# Patient Record
Sex: Male | Born: 1965
Health system: Southern US, Community
[De-identification: ages and names within clinical notes are randomized; demographics above are authoritative.]

## PROBLEM LIST (undated history)

## (undated) DIAGNOSIS — L659 Nonscarring hair loss, unspecified: Secondary | ICD-10-CM

## (undated) DIAGNOSIS — G4700 Insomnia, unspecified: Secondary | ICD-10-CM

## (undated) HISTORY — PX: ROTATOR CUFF REPAIR: SHX139

## (undated) HISTORY — DX: Insomnia, unspecified: G47.00

## (undated) HISTORY — DX: Nonscarring hair loss, unspecified: L65.9

---

## 2000-08-02 DIAGNOSIS — L649 Androgenic alopecia, unspecified: Secondary | ICD-10-CM | POA: Insufficient documentation

## 2001-02-07 DIAGNOSIS — E7800 Pure hypercholesterolemia, unspecified: Secondary | ICD-10-CM | POA: Insufficient documentation

## 2002-08-02 DIAGNOSIS — J309 Allergic rhinitis, unspecified: Secondary | ICD-10-CM | POA: Insufficient documentation

## 2004-08-02 DIAGNOSIS — B009 Herpesviral infection, unspecified: Secondary | ICD-10-CM | POA: Insufficient documentation

## 2007-11-17 DIAGNOSIS — G4700 Insomnia, unspecified: Secondary | ICD-10-CM | POA: Insufficient documentation

## 2008-10-10 DIAGNOSIS — F419 Anxiety disorder, unspecified: Secondary | ICD-10-CM | POA: Insufficient documentation

## 2009-01-14 ENCOUNTER — Ambulatory Visit: Payer: Self-pay | Admitting: Family Medicine

## 2010-02-27 ENCOUNTER — Ambulatory Visit: Payer: Self-pay | Admitting: Orthopedic Surgery

## 2010-03-19 ENCOUNTER — Observation Stay: Payer: Self-pay | Admitting: Orthopedic Surgery

## 2010-03-19 ENCOUNTER — Ambulatory Visit: Payer: Self-pay | Admitting: Orthopedic Surgery

## 2011-04-12 ENCOUNTER — Emergency Department: Payer: Self-pay | Admitting: Unknown Physician Specialty

## 2011-04-29 ENCOUNTER — Ambulatory Visit: Payer: Self-pay | Admitting: Orthopedic Surgery

## 2011-05-05 ENCOUNTER — Ambulatory Visit: Payer: Self-pay | Admitting: Orthopedic Surgery

## 2013-08-30 ENCOUNTER — Ambulatory Visit: Payer: Self-pay | Admitting: Otolaryngology

## 2014-09-30 ENCOUNTER — Ambulatory Visit: Payer: Self-pay | Admitting: Gastroenterology

## 2014-09-30 LAB — HM COLONOSCOPY

## 2015-01-18 ENCOUNTER — Other Ambulatory Visit: Payer: Self-pay | Admitting: Family Medicine

## 2015-01-20 ENCOUNTER — Encounter: Payer: Self-pay | Admitting: Family Medicine

## 2015-01-20 ENCOUNTER — Ambulatory Visit (INDEPENDENT_AMBULATORY_CARE_PROVIDER_SITE_OTHER): Payer: BLUE CROSS/BLUE SHIELD | Admitting: Family Medicine

## 2015-01-20 VITALS — BP 116/82 | HR 65 | Temp 98.2°F | Resp 16 | Ht 70.5 in | Wt 212.2 lb

## 2015-01-20 DIAGNOSIS — R0789 Other chest pain: Secondary | ICD-10-CM | POA: Diagnosis not present

## 2015-01-20 DIAGNOSIS — R079 Chest pain, unspecified: Secondary | ICD-10-CM | POA: Insufficient documentation

## 2015-01-20 MED ORDER — CLONAZEPAM 0.5 MG PO TABS: 0.5000 mg | ORAL_TABLET | Freq: Two times a day (BID) | ORAL | Status: DC | PRN

## 2015-01-20 NOTE — Progress Notes (Signed)
Subjective:     Patient ID: Luis Ray, male   DOB: [birth year], 49 y.o.   MRN: [id number]  HPI  Chief Complaint  Patient presents with  . Chest Pain    Patient comes in ofice today with complaints of chest pain for 7 days described as tightness in the middle of his chest, patient denies radiation of pain but states that he has a family history of heart disease  Describes more as intermittent, non-exertional retrosternal pressure with no associated nausea/shortness of breath/palpitations. States he can feel it mildly with deep inspiration. Has been taking ASA 81 mg for the last few days. Denies heartburn or reflux at this time. Modest control of allergies with otc antihistamines. Paternal hx of M.I. Last cholesterol in January with low 10 year cardiovascular risk.   Review of Systems  Constitutional: Negative for fever and chills.  Respiratory: Negative for cough.   Cardiovascular:       See HPI       Objective:   Physical Exam  Constitutional: He appears well-developed and well-nourished. No distress.  Cardiovascular: Normal rate and regular rhythm.   Pulmonary/Chest: Breath sounds normal. He exhibits no tenderness.  Musculoskeletal: He exhibits no edema (in lower extremities).       Assessment:     1. Chest tightness or pressure - EKG 12-Lead - Ambulatory referral to Cardiology - clonazePAM (KLONOPIN) 0.5 MG tablet; Take 1 tablet (0.5 mg total) by mouth 2 (two) times daily as needed for anxiety.  Dispense: 20 tablet; Refill: 1    Plan:    continue 81 mg. ASA

## 2015-01-20 NOTE — Patient Instructions (Signed)
Continue 81 mg. Aspirin Try clonazepam in the evening as it can make you sleepy

## 2015-01-21 ENCOUNTER — Encounter: Payer: Self-pay | Admitting: Cardiovascular Disease

## 2015-01-21 ENCOUNTER — Ambulatory Visit (INDEPENDENT_AMBULATORY_CARE_PROVIDER_SITE_OTHER): Payer: BLUE CROSS/BLUE SHIELD | Admitting: Cardiovascular Disease

## 2015-01-21 VITALS — BP 110/78 | HR 67 | Ht 70.5 in | Wt 208.0 lb

## 2015-01-21 DIAGNOSIS — R0789 Other chest pain: Secondary | ICD-10-CM

## 2015-01-21 NOTE — Progress Notes (Signed)
Primary care physician: Dr. Sherrie Mustache  HPI  This is a pleasant 49 year old male who was referred by Anola Gurney for evaluation of chest pain. The patient has no previous cardiac history. He does have family history of coronary artery disease. His father had CABG at the age of 12. Multiple members on his mother's side had myocardial infarction in their 74s and 60s. He has mild hyperlipidemia not on medications. He has no history of diabetes or hypertension. He is not a smoker. He works as a Brewing technologist. Over the last 2 weeks, he has episodes of substernal chest pressure with no radiation. It is mainly happens when he is under stress and does not happen with physical activities. He has been active throughout his life but has slowed down in the recent years with disease scheduled. He did ride his bike yesterday for 3 miles without significant symptoms.  Allergies  Allergen Reactions  . Sulfa Antibiotics Other (See Comments)    Upset stomach     Current Outpatient Prescriptions on File Prior to Visit  Medication Sig Dispense Refill  . clonazePAM (KLONOPIN) 0.5 MG tablet Take 1 tablet (0.5 mg total) by mouth 2 (two) times daily as needed for anxiety. 20 tablet 1  . finasteride (PROSCAR) 5 MG tablet TAKE ONE-FOURTH TABLET DAILY 30 tablet 11  . fluticasone (FLONASE) 50 MCG/ACT nasal spray Place into the nose.    . valACYclovir (VALTREX) 1000 MG tablet Take by mouth.     No current facility-administered medications on file prior to visit.     Past Medical History  Diagnosis Date  . Alopecia   . Insomnia      Past Surgical History  Procedure Laterality Date  . Rotator cuff repair Right      Family History  Problem Relation Age of Onset  . Coronary artery disease Father      History   Social History  . Marital Status: Married    Spouse Name: N/A  . Number of Children: N/A  . Years of Education: N/A   Occupational History  . Not on file.   Social History Main  Topics  . Smoking status: Never Smoker   . Smokeless tobacco: Not on file  . Alcohol Use: 0.0 oz/week    0 Standard drinks or equivalent per week     Comment: rare  . Drug Use: No  . Sexual Activity: Not on file   Other Topics Concern  . Not on file   Social History Narrative     ROS A 10 point review of system was performed. It is negative other than that mentioned in the history of present illness.   PHYSICAL EXAM   BP 110/78 mmHg  Pulse 67  Ht 5' 10.5" (1.791 m)  Wt 208 lb (94.348 kg)  BMI 29.41 kg/m2 Constitutional: He is oriented to person, place, and time. He appears well-developed and well-nourished. No distress.  HENT: No nasal discharge.  Head: Normocephalic and atraumatic.  Eyes: Pupils are equal and round.  No discharge. Neck: Normal range of motion. Neck supple. No JVD present. No thyromegaly present.  Cardiovascular: Normal rate, regular rhythm, normal heart sounds. Exam reveals no gallop and no friction rub. No murmur heard.  Pulmonary/Chest: Effort normal and breath sounds normal. No stridor. No respiratory distress. He has no wheezes. He has no rales. He exhibits no tenderness.  Abdominal: Soft. Bowel sounds are normal. He exhibits no distension. There is no tenderness. There is no rebound and no guarding.  Musculoskeletal: Normal range of motion. He exhibits no edema and no tenderness.  Neurological: He is alert and oriented to person, place, and time. Coordination normal.  Skin: Skin is warm and dry. No rash noted. He is not diaphoretic. No erythema. No pallor.  Psychiatric: He has a normal mood and affect. His behavior is normal. Judgment and thought content normal.       VOH:YWVPX  Rhythm  WITHIN NORMAL LIMITS   ASSESSMENT AND PLAN

## 2015-01-21 NOTE — Patient Instructions (Signed)
Medication Instructions:  Your physician recommends that you continue on your current medications as directed. Please refer to the Current Medication list given to you today.  Labwork: none  Testing/Procedures: Your physician has requested that you have an exercise tolerance test.    Follow-Up: Your physician recommends that you schedule a follow-up appointment as needed with Dr. Arida.    Any Other Special Instructions Will Be Listed Below (If Applicable).  Exercise Stress Electrocardiogram An exercise stress electrocardiogram is a test that is done to evaluate the blood supply to your heart. This test may also be called exercise stress electrocardiography. The test is done while you are walking on a treadmill. The goal of this test is to raise your heart rate. This test is done to find areas of poor blood flow to the heart by determining the extent of coronary artery disease (CAD).   CAD is defined as narrowing in one or more heart (coronary) arteries of more than 70%. If you have an abnormal test result, this may mean that you are not getting adequate blood flow to your heart during exercise. Additional testing may be needed to understand why your test was abnormal. LET YOUR HEALTH CARE PROVIDER KNOW ABOUT:   Any allergies you have.  All medicines you are taking, including vitamins, herbs, eye drops, creams, and over-the-counter medicines.  Previous problems you or members of your family have had with the use of anesthetics.  Any blood disorders you have.  Previous surgeries you have had.  Medical conditions you have.  Possibility of pregnancy, if this applies. RISKS AND COMPLICATIONS Generally, this is a safe procedure. However, as with any procedure, complications can occur. Possible complications can include:  Pain or pressure in the following areas:  Chest.  Jaw or neck.  Between your shoulder blades.  Radiating down your left arm.  Dizziness or  light-headedness.  Shortness of breath.  Increased or irregular heartbeats.  Nausea or vomiting.  Heart attack (rare). BEFORE THE PROCEDURE  Avoid all forms of caffeine 24 hours before your test or as directed by your health care provider. This includes coffee, tea (even decaffeinated tea), caffeinated sodas, chocolate, cocoa, and certain pain medicines.  Follow your health care provider's instructions regarding eating and drinking before the test.  Take your medicines as directed at regular times with water unless instructed otherwise. Exceptions may include:  If you have diabetes, ask how you are to take your insulin or pills. It is common to adjust insulin dosing the morning of the test.  If you are taking beta-blocker medicines, it is important to talk to your health care provider about these medicines well before the date of your test. Taking beta-blocker medicines may interfere with the test. In some cases, these medicines need to be changed or stopped 24 hours or more before the test.  If you wear a nitroglycerin patch, it may need to be removed prior to the test. Ask your health care provider if the patch should be removed before the test.  If you use an inhaler for any breathing condition, bring it with you to the test.  If you are an outpatient, bring a snack so you can eat right after the stress phase of the test.  Do not smoke for 4 hours prior to the test or as directed by your health care provider.  Do not apply lotions, powders, creams, or oils on your chest prior to the test.  Wear loose-fitting clothes and comfortable shoes for the test.   This test involves walking on a treadmill. PROCEDURE  Multiple patches (electrodes) will be put on your chest. If needed, small areas of your chest may have to be shaved to get better contact with the electrodes. Once the electrodes are attached to your body, multiple wires will be attached to the electrodes and your heart rate will  be monitored.  Your heart will be monitored both at rest and while exercising.  You will walk on a treadmill. The treadmill will be started at a slow pace. The treadmill speed and incline will gradually be increased to raise your heart rate. AFTER THE PROCEDURE  Your heart rate and blood pressure will be monitored after the test.  You may return to your normal schedule including diet, activities, and medicines, unless your health care provider tells you otherwise. Document Released: 07/16/2000 Document Revised: 07/24/2013 Document Reviewed: 03/26/2013 ExitCare Patient Information 2015 [employer]. This information is not intended to replace advice given to you by your health care provider. Make sure you discuss any questions you have with your health care provider.  

## 2015-01-21 NOTE — Assessment & Plan Note (Signed)
The patient's chest pain is atypical and likely stress induced. Physical exam is unremarkable and baseline ECG is normal. Given his family history and risk factors for coronary artery disease, I requested a treadmill stress test. I discussed with the patient the importance of lifestyle changes in order to decrease the chance of future coronary artery disease and cardiovascular events. We discussed the importance of controlling risk factors, healthy diet as well as regular exercise. I also explained to him that a normal stress test does not rule out atherosclerosis.

## 2015-02-13 ENCOUNTER — Ambulatory Visit (INDEPENDENT_AMBULATORY_CARE_PROVIDER_SITE_OTHER): Payer: BLUE CROSS/BLUE SHIELD

## 2015-02-13 DIAGNOSIS — R0789 Other chest pain: Secondary | ICD-10-CM

## 2015-02-13 NOTE — Patient Instructions (Signed)
Your stress test did not show any signs concerning for ischemia  Dr. Kirke CorinArida will review your readings   Call or return to clinic prn if these symptoms worsen or fail to improve as anticipated.

## 2015-03-07 ENCOUNTER — Other Ambulatory Visit: Payer: Self-pay | Admitting: Family Medicine

## 2015-04-30 ENCOUNTER — Other Ambulatory Visit: Payer: Self-pay | Admitting: Family Medicine

## 2015-04-30 ENCOUNTER — Telehealth: Payer: Self-pay | Admitting: Family Medicine

## 2015-04-30 NOTE — Telephone Encounter (Signed)
Needs refill on clonazePAM (KLONOPIN) 0.5 MG tablet 03/07/15 -- Anola Gurney, PA TAKE 1 TABLET TWICE A DAY AS NEEDED FOR ANXIETY  Call back is [phone]  Thanks Barth Kirks

## 2015-04-30 NOTE — Telephone Encounter (Signed)
Pt's wife Cala Bradford is requesting a refill for clonazePAM (KLONOPIN) 0.5 MG tablet sent to [facility] with multiple refills so they don't have to request it every month. Thanks TNP

## 2015-04-30 NOTE — Telephone Encounter (Signed)
Spoke to patient on phone medication was picked up from pharmacy on 03/11/15, with script you sent it had 1 additional refill. Patient stated his bottle had no refills. I contacted [facility] pharmacy who informed me that script was filled on 03/11/15 then again on 04/04/15. Please advise. KW

## 2015-07-15 ENCOUNTER — Other Ambulatory Visit: Payer: Self-pay | Admitting: Family Medicine

## 2015-09-16 ENCOUNTER — Ambulatory Visit
Admission: RE | Admit: 2015-09-16 | Discharge: 2015-09-16 | Disposition: A | Discharge status: Home / Self Care | Payer: BLUE CROSS/BLUE SHIELD | Source: Ambulatory Visit | Attending: Family Medicine | Admitting: Family Medicine

## 2015-09-16 ENCOUNTER — Ambulatory Visit (INDEPENDENT_AMBULATORY_CARE_PROVIDER_SITE_OTHER): Payer: BLUE CROSS/BLUE SHIELD | Admitting: Family Medicine

## 2015-09-16 ENCOUNTER — Telehealth: Payer: Self-pay

## 2015-09-16 ENCOUNTER — Encounter: Payer: Self-pay | Admitting: Family Medicine

## 2015-09-16 VITALS — BP 118/80 | HR 60 | Temp 98.5°F | Resp 16 | Wt 219.0 lb

## 2015-09-16 DIAGNOSIS — R739 Hyperglycemia, unspecified: Secondary | ICD-10-CM | POA: Insufficient documentation

## 2015-09-16 DIAGNOSIS — R079 Chest pain, unspecified: Secondary | ICD-10-CM | POA: Diagnosis not present

## 2015-09-16 DIAGNOSIS — F430 Acute stress reaction: Secondary | ICD-10-CM

## 2015-09-16 DIAGNOSIS — F439 Reaction to severe stress, unspecified: Secondary | ICD-10-CM | POA: Diagnosis not present

## 2015-09-16 DIAGNOSIS — K219 Gastro-esophageal reflux disease without esophagitis: Secondary | ICD-10-CM | POA: Diagnosis not present

## 2015-09-16 DIAGNOSIS — E7800 Pure hypercholesterolemia, unspecified: Secondary | ICD-10-CM

## 2015-09-16 MED ORDER — OMEPRAZOLE 20 MG PO CPDR: 20.0000 mg | DELAYED_RELEASE_CAPSULE | Freq: Every day | ORAL | Status: DC

## 2015-09-16 NOTE — Telephone Encounter (Signed)
Advised patient as below.  

## 2015-09-16 NOTE — Telephone Encounter (Signed)
Left message to call back  

## 2015-09-16 NOTE — Telephone Encounter (Signed)
LMTCB 09/16/2015  Thanks,   -Sherrell Weir  

## 2015-09-16 NOTE — Telephone Encounter (Signed)
-----   Message from Lorie Phenix, MD sent at 09/16/2015  3:10 PM EST ----- Normal. Please notify patient.  Thanks.

## 2015-09-16 NOTE — Progress Notes (Signed)
Patient ID: Luis Ray, Ray   DOB: 1966-06-19, 50 y.o.   MRN: [id number]         Patient: Luis Ray    DOB: 1966/03/12   50 y.o.   MRN: [id number] Visit Date: 09/16/2015  Today's Provider: Lorie Phenix, MD   Chief Complaint  Patient presents with  . Chest Pain   Subjective:    Chest Pain  This is a new problem. The current episode started more than 1 month ago. The onset quality is gradual. The problem occurs daily. The pain is present in the substernal region. The pain is at a severity of 3/10. The quality of the pain is described as tightness and pressure. The pain does not radiate. Associated symptoms include shortness of breath. Pertinent negatives include no abdominal pain, back pain, cough, diaphoresis, dizziness, exertional chest pressure, fever, headaches, irregular heartbeat, leg pain, lower extremity edema, nausea, near-syncope, numbness, palpitations, syncope, vomiting or weakness. Risk factors include Ray gender.  Pertinent negatives for past medical history include no seizures.       Allergies  Allergen Reactions  . Sulfa Antibiotics Other (See Comments)    Upset stomach   Previous Medications   ASPIRIN EC 81 MG TABLET    Take 81 mg by mouth daily.   CLONAZEPAM (KLONOPIN) 0.5 MG TABLET    1/2 tablet twice daily as needed for anxiety   FINASTERIDE (PROSCAR) 5 MG TABLET    TAKE ONE-FOURTH TABLET DAILY   FLUTICASONE (FLONASE) 50 MCG/ACT NASAL SPRAY    Place into the nose.   VALACYCLOVIR (VALTREX) 1000 MG TABLET    TAKE TWO TABLETS TWICE A DAY AS NEEDED    Review of Systems  Constitutional: Positive for fatigue. Negative for fever, chills, diaphoresis, activity change, appetite change and unexpected weight change.  Respiratory: Positive for chest tightness and shortness of breath. Negative for apnea, cough, choking and wheezing.   Cardiovascular: Positive for chest pain. Negative for palpitations, leg swelling, syncope and near-syncope.  Gastrointestinal:  Negative for nausea, vomiting, abdominal pain, diarrhea, constipation, blood in stool, abdominal distention, anal bleeding and rectal pain.  Musculoskeletal: Negative for back pain.  Neurological: Negative for dizziness, seizures, syncope, speech difficulty, weakness, light-headedness, numbness and headaches.    Social History  Substance Use Topics  . Smoking status: Never Smoker   . Smokeless tobacco: Not on file  . Alcohol Use: 0.0 oz/week    0 Standard drinks or equivalent per week     Comment: rare   Objective:   BP 118/80 mmHg  Pulse 60  Temp(Src) 98.5 F (36.9 C) (Oral)  Resp 16  Wt 219 lb (99.338 kg)  Physical Exam  Constitutional: He is oriented to person, place, and time. He appears well-developed and well-nourished.  Cardiovascular: Normal rate, regular rhythm and normal heart sounds.   Pulmonary/Chest: Effort normal and breath sounds normal.  Musculoskeletal: Normal range of motion. He exhibits no tenderness.  Neurological: He is alert and oriented to person, place, and time.  Psychiatric: He has a normal mood and affect. His behavior is normal. Judgment and thought content normal.      Assessment & Plan:     1. Chest pain, unspecified chest pain type New problem. EKG normal today. Will check CXR.  - EKG 12-Lead - DG Chest 2 View  2. Hypercholesterolemia without hypertriglyceridemia Elevated last check. Has gained weight. Will recheck.  - CBC with Differential/Platelet - Lipid panel  3. Acute stress disorder Very anxious. Check TSH. Decrease caffeine.  -  TSH  4. Hyperglycemia Has gained weight. Does have family history of diabetes. Check labs.   - CBC with Differential/Platelet - Comprehensive metabolic panel - Hemoglobin A1c  5. Gastroesophageal reflux disease without esophagitis May be cause of pain. Discussed starting medication, healthy lifestyle changes, weight loss, increase exercise.  Decreased carbohydrates. Further plan pending these results.    - omeprazole (PRILOSEC) 20 MG capsule; Take 1 capsule (20 mg total) by mouth daily.  Dispense: 90 capsule; Refill: 1     Patient was seen and examined by Leo Grosser, MD, and note scribed by Kavin Leech, CMA.  I have reviewed the document for accuracy and completeness and I agree with above. - Leo Grosser, MD   Lorie Phenix, MD  [facility]

## 2015-09-16 NOTE — Telephone Encounter (Signed)
-----   Message from Nancy Maloney, MD sent at 09/16/2015  3:10 PM EST ----- Normal. Please notify patient.  Thanks. 

## 2015-09-18 ENCOUNTER — Telehealth: Payer: Self-pay

## 2015-09-18 LAB — COMPREHENSIVE METABOLIC PANEL
A/G RATIO: 2 (ref 1.1–2.5)
ALBUMIN: 4.5 g/dL (ref 3.5–5.5)
ALK PHOS: 60 IU/L (ref 39–117)
ALT: 28 IU/L (ref 0–44)
AST: 21 IU/L (ref 0–40)
BUN/Creatinine Ratio: 18 (ref 9–20)
BUN: 21 mg/dL (ref 6–24)
Bilirubin Total: 0.4 mg/dL (ref 0.0–1.2)
CHLORIDE: 104 mmol/L (ref 96–106)
CO2: 22 mmol/L (ref 18–29)
Calcium: 9.4 mg/dL (ref 8.7–10.2)
Creatinine, Ser: 1.16 mg/dL (ref 0.76–1.27)
GFR calc non Af Amer: 73 mL/min/{1.73_m2} (ref >59)
GFR, EST AFRICAN AMERICAN: 84 mL/min/{1.73_m2} (ref >59)
GLOBULIN, TOTAL: 2.2 g/dL (ref 1.5–4.5)
GLUCOSE: 96 mg/dL (ref 65–99)
Potassium: 4.6 mmol/L (ref 3.5–5.2)
Sodium: 141 mmol/L (ref 134–144)
Total Protein: 6.7 g/dL (ref 6.0–8.5)

## 2015-09-18 LAB — CBC WITH DIFFERENTIAL/PLATELET
BASOS: 1 %
Basophils Absolute: 0 10*3/uL (ref 0.0–0.2)
EOS (ABSOLUTE): 0.2 10*3/uL (ref 0.0–0.4)
EOS: 3 %
HEMATOCRIT: 43.9 % (ref 37.5–51.0)
HEMOGLOBIN: 14.8 g/dL (ref 12.6–17.7)
Immature Grans (Abs): 0 10*3/uL (ref 0.0–0.1)
Immature Granulocytes: 0 %
LYMPHS ABS: 1.5 10*3/uL (ref 0.7–3.1)
Lymphs: 29 %
MCH: 29.9 pg (ref 26.6–33.0)
MCHC: 33.7 g/dL (ref 31.5–35.7)
MCV: 89 fL (ref 79–97)
Monocytes Absolute: 0.7 10*3/uL (ref 0.1–0.9)
Monocytes: 13 %
NEUTROS ABS: 2.8 10*3/uL (ref 1.4–7.0)
Neutrophils: 54 %
Platelets: 230 10*3/uL (ref 150–379)
RBC: 4.95 x10E6/uL (ref 4.14–5.80)
RDW: 13.5 % (ref 12.3–15.4)
WBC: 5.2 10*3/uL (ref 3.4–10.8)

## 2015-09-18 LAB — LIPID PANEL
CHOLESTEROL TOTAL: 204 mg/dL — AB (ref 100–199)
Chol/HDL Ratio: 3.9 ratio units (ref 0.0–5.0)
HDL: 52 mg/dL (ref >39)
LDL Calculated: 136 mg/dL — ABNORMAL HIGH (ref 0–99)
Triglycerides: 82 mg/dL (ref 0–149)
VLDL CHOLESTEROL CAL: 16 mg/dL (ref 5–40)

## 2015-09-18 LAB — TSH: TSH: 2.06 u[IU]/mL (ref 0.450–4.500)

## 2015-09-18 LAB — HEMOGLOBIN A1C
Est. average glucose Bld gHb Est-mCnc: 105 mg/dL
Hgb A1c MFr Bld: 5.3 % (ref 4.8–5.6)

## 2015-09-18 NOTE — Telephone Encounter (Signed)
-----   Message from Lorie Phenix, MD sent at 09/18/2015  7:22 AM EST ----- Labs look good. Cholesterol very slightly elevated but does have high good cholesterol  So 10 year risk of heart disease is only 3 percent.  Unclear is statin would make any difference in 10 year risk of heart disease.    No good data.   Would recommend eat healthy, including healthy fats and lower carbs as discussed at ov. Would look into information on the Mediterranean diet.   That has been show to decrease risk of heart disease. Please see how patient is feeling. Thanks.

## 2015-09-18 NOTE — Telephone Encounter (Signed)
Advised pt of lab results. Pt verbally acknowledges understanding. Emily Drozdowski, CMA   

## 2015-12-02 ENCOUNTER — Other Ambulatory Visit: Payer: Self-pay | Admitting: Family Medicine

## 2015-12-03 ENCOUNTER — Telehealth: Payer: Self-pay

## 2015-12-03 ENCOUNTER — Other Ambulatory Visit: Payer: Self-pay | Admitting: Family Medicine

## 2015-12-03 NOTE — Telephone Encounter (Signed)
[facility]KW

## 2016-01-30 ENCOUNTER — Other Ambulatory Visit: Payer: Self-pay | Admitting: Family Medicine

## 2016-02-09 ENCOUNTER — Other Ambulatory Visit: Payer: Self-pay | Admitting: Family Medicine

## 2016-03-31 DIAGNOSIS — H16041 Marginal corneal ulcer, right eye: Secondary | ICD-10-CM | POA: Diagnosis not present

## 2016-04-02 ENCOUNTER — Other Ambulatory Visit: Payer: Self-pay | Admitting: Family Medicine

## 2016-04-02 DIAGNOSIS — K219 Gastro-esophageal reflux disease without esophagitis: Secondary | ICD-10-CM

## 2016-04-07 DIAGNOSIS — H10013 Acute follicular conjunctivitis, bilateral: Secondary | ICD-10-CM | POA: Diagnosis not present

## 2016-07-12 ENCOUNTER — Other Ambulatory Visit: Payer: Self-pay | Admitting: Family Medicine

## 2016-07-13 NOTE — Telephone Encounter (Signed)
Prescription has been called in and patient has been notified. KW 

## 2016-08-31 ENCOUNTER — Encounter: Payer: Self-pay | Admitting: Family Medicine

## 2016-08-31 ENCOUNTER — Ambulatory Visit (INDEPENDENT_AMBULATORY_CARE_PROVIDER_SITE_OTHER): Payer: BLUE CROSS/BLUE SHIELD | Admitting: Family Medicine

## 2016-08-31 VITALS — BP 100/64 | HR 67 | Temp 98.8°F | Resp 16 | Ht 70.5 in | Wt 218.2 lb

## 2016-08-31 DIAGNOSIS — E7800 Pure hypercholesterolemia, unspecified: Secondary | ICD-10-CM

## 2016-08-31 DIAGNOSIS — K219 Gastro-esophageal reflux disease without esophagitis: Secondary | ICD-10-CM | POA: Diagnosis not present

## 2016-08-31 DIAGNOSIS — Z23 Encounter for immunization: Secondary | ICD-10-CM

## 2016-08-31 DIAGNOSIS — F419 Anxiety disorder, unspecified: Secondary | ICD-10-CM

## 2016-08-31 DIAGNOSIS — Z0000 Encounter for general adult medical examination without abnormal findings: Secondary | ICD-10-CM

## 2016-08-31 DIAGNOSIS — B009 Herpesviral infection, unspecified: Secondary | ICD-10-CM

## 2016-08-31 NOTE — Patient Instructions (Signed)
We will call you with the lab results. Discussed trying Zantac 150 mg.daily to control your heartburn and taper off Prilosec. Use Prilosec for flare ups.

## 2016-08-31 NOTE — Progress Notes (Signed)
Subjective:     Patient ID: Luis Ray, male   DOB: 05/02/1966, [age] y.o.   MRN: [id number]  HPI  Chief Complaint  Patient presents with  . Annual Exam    Patient comes in office today for his annual physical he states that he has no questions or conerns today. Patient reports that he follows a well balanced diet but is not actively exercising, paitent reports that he sleeps between 8-10hrs a night. Patients last reported Tdap 09/05/09, Colonoscopy 206, PSA  11/22/12. Patient is due today for flu vaccine and screening labs.   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 States his children are 7511 and 52 years old.   Review of Systems General: Feeling well. Gets occasional outbreaks of cold sores HEENT: regular dental visits and eye exams (contacts). States he uses CPAP at night for snoring. Has not had an evaluation for sleep apnea. Cardiovascular: no chest pain, shortness of breath, or palpitations GI: heartburn controlled by medication., no change in bowel habits or blood in the stool GU: nocturia x 1, no change in bladder habits, no erectile dysfunction Psychiatric: not depressed; has chronic anxiety for which he will take 1/2 a pill in the AM and one in the afternoon on occasion. Musculoskeletal: no joint pain    Objective:   Physical Exam  Constitutional: He appears well-developed and well-nourished. No distress.  Eyes: PERRLA, EOMI Neck: no thyromegaly, tenderness or nodules, no cervical adenopathy or carotid bruits ENT: TM's intact without inflammation; No tonsillar enlargement or exudate, Lungs: Clear Heart : RRR without murmur or gallop Abd: bowel sounds present, soft, non-tender, no organomegaly Rectal: Prostate firm and non-tender Extremities: no edema Skin: no atypical lesions noted on his back     Assessment:    1. Need for influenza vaccination - Flu Vaccine QUAD 36+ mos IM  2. Annual physical exam - Comprehensive metabolic panel - PSA  3. Hypercholesterolemia  without hypertriglyceridemia - Lipid panel  4. Herpes: Valtrex prn  5. Gastroesophageal reflux disease without esophagitis: on omeprazole  6. Chronic anxiety: continue clonazepam Plan:    Discussed switching to Zantac 150 mg.and tapering off omeprazole.Further f/u pending alb work.

## 2016-09-01 ENCOUNTER — Telehealth: Payer: Self-pay

## 2016-09-01 LAB — COMPREHENSIVE METABOLIC PANEL
A/G RATIO: 1.8 (ref 1.2–2.2)
ALK PHOS: 61 IU/L (ref 39–117)
ALT: 23 IU/L (ref 0–44)
AST: 15 IU/L (ref 0–40)
Albumin: 4.6 g/dL (ref 3.5–5.5)
BILIRUBIN TOTAL: 0.5 mg/dL (ref 0.0–1.2)
BUN/Creatinine Ratio: 13 (ref 9–20)
BUN: 14 mg/dL (ref 6–24)
CHLORIDE: 102 mmol/L (ref 96–106)
CO2: 25 mmol/L (ref 18–29)
Calcium: 9.8 mg/dL (ref 8.7–10.2)
Creatinine, Ser: 1.08 mg/dL (ref 0.76–1.27)
GFR calc Af Amer: 91 mL/min/{1.73_m2} (ref >59)
GFR calc non Af Amer: 78 mL/min/{1.73_m2} (ref >59)
GLUCOSE: 86 mg/dL (ref 65–99)
Globulin, Total: 2.6 g/dL (ref 1.5–4.5)
POTASSIUM: 4.2 mmol/L (ref 3.5–5.2)
Sodium: 141 mmol/L (ref 134–144)
TOTAL PROTEIN: 7.2 g/dL (ref 6.0–8.5)

## 2016-09-01 LAB — LIPID PANEL
Chol/HDL Ratio: 4 ratio units (ref 0.0–5.0)
Cholesterol, Total: 216 mg/dL — ABNORMAL HIGH (ref 100–199)
HDL: 54 mg/dL (ref >39)
LDL Calculated: 144 mg/dL — ABNORMAL HIGH (ref 0–99)
TRIGLYCERIDES: 91 mg/dL (ref 0–149)
VLDL CHOLESTEROL CAL: 18 mg/dL (ref 5–40)

## 2016-09-01 LAB — PSA: Prostate Specific Ag, Serum: 0.3 ng/mL (ref 0.0–4.0)

## 2016-09-01 NOTE — Telephone Encounter (Signed)
-----   Message from Anola Gurneyobert Chauvin, [place] sent at 09/01/2016  7:56 AM EST ----- Labs are good. Mild cholesterol elevation: your calculated 10 year risk for developing cardiovascular disease ins 2.8%. This is below the 7.5 % risk when we recommend a cholesterol lowering drug. Recommend walking program 30 minutes daily and you may see your weight go down.

## 2016-09-01 NOTE — Telephone Encounter (Signed)
Patient has been advised. KW 

## 2016-09-03 ENCOUNTER — Other Ambulatory Visit: Payer: Self-pay | Admitting: Family Medicine

## 2016-09-06 ENCOUNTER — Other Ambulatory Visit: Payer: Self-pay | Admitting: Family Medicine

## 2016-10-09 ENCOUNTER — Other Ambulatory Visit: Payer: Self-pay | Admitting: Family Medicine

## 2016-10-11 NOTE — Telephone Encounter (Signed)
[facility] KW

## 2017-01-02 DIAGNOSIS — J0100 Acute maxillary sinusitis, unspecified: Secondary | ICD-10-CM | POA: Diagnosis not present

## 2017-01-21 ENCOUNTER — Other Ambulatory Visit: Payer: Self-pay | Admitting: Family Medicine

## 2017-01-21 MED ORDER — CLONAZEPAM 0.5 MG PO TABS: 0.2500 mg | ORAL_TABLET | Freq: Two times a day (BID) | ORAL | 1 refills | Status: DC

## 2017-01-21 NOTE — Progress Notes (Signed)
rx called in-aa 

## 2017-01-31 ENCOUNTER — Encounter: Payer: Self-pay | Admitting: Medical

## 2017-01-31 ENCOUNTER — Ambulatory Visit: Payer: Self-pay | Admitting: Medical

## 2017-01-31 VITALS — BP 118/75 | HR 67 | Temp 97.6°F | Resp 16 | Ht 70.0 in | Wt 213.0 lb

## 2017-01-31 DIAGNOSIS — R0982 Postnasal drip: Secondary | ICD-10-CM

## 2017-01-31 DIAGNOSIS — J069 Acute upper respiratory infection, unspecified: Secondary | ICD-10-CM

## 2017-01-31 DIAGNOSIS — R059 Cough, unspecified: Secondary | ICD-10-CM

## 2017-01-31 DIAGNOSIS — R05 Cough: Secondary | ICD-10-CM

## 2017-01-31 MED ORDER — ALBUTEROL SULFATE HFA 108 (90 BASE) MCG/ACT IN AERS: 2.0000 | INHALATION_SPRAY | Freq: Four times a day (QID) | RESPIRATORY_TRACT | 0 refills | Status: DC | PRN

## 2017-01-31 MED ORDER — BENZONATATE 100 MG PO CAPS: 100.0000 mg | ORAL_CAPSULE | Freq: Three times a day (TID) | ORAL | 0 refills | Status: DC | PRN

## 2017-01-31 MED ORDER — ALBUTEROL SULFATE HFA 108 (90 BASE) MCG/ACT IN AERS: 2.0000 | INHALATION_SPRAY | Freq: Four times a day (QID) | RESPIRATORY_TRACT | 2 refills | Status: DC | PRN

## 2017-01-31 NOTE — Progress Notes (Signed)
   Subjective:    Patient ID: Luis Ray, male    DOB: 02/21/1966, 51 y.o.   MRN: [id number]  HPI 51 yo started  [facility]  Diagnosed  with upper respiratory infection placed on antibiotics , but cannot recall the name of them.  Felt better after taking antibiotics.   No fever now , just cough nonproductive then adds clear sometimes. Feels better then he did before antibiotics. Wife was sick and had to come in for antibiotic change. He is still coughing.   Review of Systems  Constitutional: Negative for chills and fever.  HENT: Negative for congestion, ear pain, sinus pressure and sore throat.   Eyes: Negative for discharge and itching.  Respiratory: Positive for cough. Negative for shortness of breath.   Cardiovascular: Negative for chest pain.  Gastrointestinal: Negative for abdominal pain.  Genitourinary: Negative for hematuria.  Musculoskeletal: Positive for myalgias.  Skin: Negative for rash.  Neurological: Negative for dizziness and speech difficulty.  Hematological: Negative for adenopathy.  Psychiatric/Behavioral: Negative for confusion and hallucinations.     maxillary pressure Gets "winded" going to the mail box , 30-40 yards. Body aches this morning. Headache today Objective:   Physical Exam  Constitutional: He is oriented to person, place, and time. He appears well-developed and well-nourished.  HENT:  Head: Normocephalic and atraumatic.  Eyes: Conjunctivae and EOM are normal. Pupils are equal, round, and reactive to light.  Neck: Normal range of motion. Neck supple.  Cardiovascular: Normal rate and regular rhythm.  Exam reveals no gallop and no friction rub.   No murmur heard. Pulmonary/Chest: Effort normal and breath sounds normal.  Musculoskeletal: Normal range of motion.  Neurological: He is alert and oriented to person, place, and time.  Skin: Skin is warm and dry.  Psychiatric: He has a normal mood and affect. His behavior is  normal. Judgment and thought content normal.  Nursing note and vitals reviewed.   Clearing throat in room a lot, mild cough.      Assessment & Plan:  Viral upper respiratory infection, cough Benzonatate Perles 100 mg one  By mouth there times a day as needed for cough.#20 no refills Albuterol MDI  2 puffs every 6 hours as needed for cough, or shortness of breath. #1 no refills OTC flonase daily 2  Sprays each nostril twice daily for post nasal drip. Saline Nasal rinses 3-4 times per day as needed. Call or return to clinic if cough productive becomes yellow or green, or fever  100 or higher. Shortness of breath not resolved by inhaler.

## 2017-02-08 ENCOUNTER — Other Ambulatory Visit: Payer: Self-pay | Admitting: Family Medicine

## 2017-04-18 NOTE — Progress Notes (Signed)
Patient comes in today for a BP check. He reports that he has been having some occasional mid-chest pain for the last 3 days. He feels that his BP may be running high. He has not been taking anything OTC for his symptoms. He reports that he does have a PMH of anxiety and GERD, both in which he takes medications for. He reports that the medications have been controlling his symptoms up until a few days ago. He has also had a cardiac workup in the past and everything was normal. He denies any headache, numbness or tingling in his extremities, nausea, or upper back pain. He does feel short of breath at times, but this comes and goes.  Advised Toni Arthurs, PA as above, and he recommends that the patient increase clonazepam to 1 tablet twice daily to see if his symptoms improve. If not improving, patient was advised to schedule an appt to be seen.

## 2017-04-19 ENCOUNTER — Other Ambulatory Visit: Payer: Self-pay | Admitting: Family Medicine

## 2017-04-19 ENCOUNTER — Telehealth: Payer: Self-pay | Admitting: Family Medicine

## 2017-04-19 MED ORDER — CLONAZEPAM 0.5 MG PO TABS: 0.2500 mg | ORAL_TABLET | Freq: Two times a day (BID) | ORAL | 1 refills | Status: DC

## 2017-04-19 NOTE — Progress Notes (Signed)
Rx called into pharmacy.KW 

## 2017-04-19 NOTE — Telephone Encounter (Signed)
done

## 2017-04-19 NOTE — Telephone Encounter (Signed)
Pt contacted office for refill request on the following medications:  clonazePAM (KLONOPIN) 0.5 MG tablet   [facility].  ZO#[phone]/[phone]

## 2017-05-25 ENCOUNTER — Other Ambulatory Visit: Payer: Self-pay | Admitting: Family Medicine

## 2017-05-26 NOTE — Telephone Encounter (Signed)
Prescription has been called into pharmacy. KW 

## 2017-08-09 ENCOUNTER — Other Ambulatory Visit: Payer: Self-pay | Admitting: Family Medicine

## 2017-08-09 DIAGNOSIS — K219 Gastro-esophageal reflux disease without esophagitis: Secondary | ICD-10-CM

## 2017-09-14 ENCOUNTER — Other Ambulatory Visit: Payer: Self-pay | Admitting: Family Medicine

## 2017-09-14 NOTE — Telephone Encounter (Signed)
Appears to be a patient of Dr. Esmond CamperFishers, please review request.KW

## 2017-09-14 NOTE — Telephone Encounter (Signed)
Patient's wife is requesting a refill on the following medication  clonazePAM (KLONOPIN) 0.5 MG tablet  [facility] uses [facility]  He is completely out.

## 2017-09-14 NOTE — Telephone Encounter (Signed)
Please review. Thanks!  

## 2017-09-14 NOTE — Telephone Encounter (Signed)
This is bob's patient, but has not been seen in over a year. Can call in 30 day prescription but he needs to make an appointment for CPE with bob within the next month.

## 2017-09-15 MED ORDER — CLONAZEPAM 0.5 MG PO TABS: 0.5000 mg | ORAL_TABLET | Freq: Two times a day (BID) | ORAL | 1 refills | Status: DC

## 2017-09-15 NOTE — Telephone Encounter (Signed)
Prescription has been called into pharmacy, left message for patient to schedule appt. KW

## 2017-09-16 ENCOUNTER — Encounter: Payer: Self-pay | Admitting: Family Medicine

## 2017-09-16 ENCOUNTER — Ambulatory Visit (INDEPENDENT_AMBULATORY_CARE_PROVIDER_SITE_OTHER): Payer: BLUE CROSS/BLUE SHIELD | Admitting: Family Medicine

## 2017-09-16 VITALS — BP 110/80 | HR 67 | Temp 98.6°F | Resp 16 | Wt 213.4 lb

## 2017-09-16 DIAGNOSIS — E7800 Pure hypercholesterolemia, unspecified: Secondary | ICD-10-CM

## 2017-09-16 DIAGNOSIS — B001 Herpesviral vesicular dermatitis: Secondary | ICD-10-CM | POA: Diagnosis not present

## 2017-09-16 DIAGNOSIS — Z23 Encounter for immunization: Secondary | ICD-10-CM | POA: Diagnosis not present

## 2017-09-16 DIAGNOSIS — F419 Anxiety disorder, unspecified: Secondary | ICD-10-CM

## 2017-09-16 DIAGNOSIS — Z0000 Encounter for general adult medical examination without abnormal findings: Secondary | ICD-10-CM | POA: Diagnosis not present

## 2017-09-16 MED ORDER — VALACYCLOVIR HCL 1 G PO TABS: ORAL_TABLET | ORAL | 1 refills | Status: DC

## 2017-09-16 NOTE — Progress Notes (Signed)
Subjective:     Patient ID: Luis Ray, male   DOB: 10/11/1965, 52 y.o.   MRN: [id number] Chief Complaint  Patient presents with  . Annual Exam    Patient comes in office today for his annual physical he states that he is feeling well and has no complaints or concerns to address today. Patient states that he is following a well balanced diet, actively exercising 30min daily and is sleeping on average 8-10hr a night. Patients last reported Tdap 09/05/09, Flu 08/31/16, PSA 08/31/16, Colonoscopy screen 09/30/14   HPI Continues to work in [employer]. Likes to do calisthenic exercise and has a road bike but not using at the present time. States he is getting over a cold.  Review of Systems General: Feeling well, will get flu shot today. HEENT: regular dental visits and eye exams (contacts). Use finasteride for alopecia. Cardiovascular: no chest pain, shortness of breath, or palpitations GI: no heartburn as controlled with a PPI. Discussed switching to Zantac. No change in bowel habits or blood in the stool GU: nocturia x 1-2, no change in bladder habits  Psychiatric: not depressed Musculoskeletal: no joint pain    Objective:   Physical Exam  Constitutional: He appears well-developed and well-nourished. No distress.  Eyes: PERRLA, EOMI Neck: no thyromegaly, tenderness or nodules, mild right anterior cervical tenderness, no carotid bruits. ENT: TM's intact without inflammation; No tonsillar enlargement or exudate, Lungs: Clear Heart : RRR without murmur or gallop Abd: bowel sounds present, soft, non-tender, no organomegaly Rectal: Prostate firm and non-tender Extremities: no edema Skin: no atypical lesions noted on his back     Assessment:    1. Annual physical exam - Comprehensive metabolic panel - PSA  2. Chronic anxiety: continue clonazepam, helps with somatic sx  3. Hypercholesterolemia without hypertriglyceridemia - Lipid panel  4. Need for influenza vaccination - Flu  Vaccine QUAD 36+ mos IM  5. Herpes labialis - valACYclovir (VALTREX) 1000 MG tablet; Take two tablets every 12 hours for one day for a flare  Dispense: 30 tablet; Refill: 1    Plan:    Encouraged daily exercise. Further f/u pending lab results.

## 2017-09-16 NOTE — Patient Instructions (Addendum)
Try Zantac twice daily instead of omeprazole (Prilosec). Encourage 30 minutes of exercise daily.We will call you with the lab results..Marland Kitchen

## 2017-09-17 LAB — COMPREHENSIVE METABOLIC PANEL
ALK PHOS: 79 IU/L (ref 39–117)
ALT: 23 IU/L (ref 0–44)
AST: 17 IU/L (ref 0–40)
Albumin/Globulin Ratio: 1.5 (ref 1.2–2.2)
Albumin: 4.5 g/dL (ref 3.5–5.5)
BUN/Creatinine Ratio: 15 (ref 9–20)
BUN: 16 mg/dL (ref 6–24)
Bilirubin Total: 0.4 mg/dL (ref 0.0–1.2)
CALCIUM: 9.8 mg/dL (ref 8.7–10.2)
CO2: 22 mmol/L (ref 20–29)
CREATININE: 1.1 mg/dL (ref 0.76–1.27)
Chloride: 102 mmol/L (ref 96–106)
GFR calc Af Amer: 88 mL/min/{1.73_m2} (ref >59)
GFR calc non Af Amer: 76 mL/min/{1.73_m2} (ref >59)
GLOBULIN, TOTAL: 3 g/dL (ref 1.5–4.5)
GLUCOSE: 85 mg/dL (ref 65–99)
Potassium: 4.1 mmol/L (ref 3.5–5.2)
SODIUM: 141 mmol/L (ref 134–144)
Total Protein: 7.5 g/dL (ref 6.0–8.5)

## 2017-09-17 LAB — PSA: Prostate Specific Ag, Serum: 0.2 ng/mL (ref 0.0–4.0)

## 2017-09-17 LAB — LIPID PANEL
CHOLESTEROL TOTAL: 205 mg/dL — AB (ref 100–199)
Chol/HDL Ratio: 4.8 ratio (ref 0.0–5.0)
HDL: 43 mg/dL (ref >39)
LDL CALC: 141 mg/dL — AB (ref 0–99)
TRIGLYCERIDES: 105 mg/dL (ref 0–149)
VLDL Cholesterol Cal: 21 mg/dL (ref 5–40)

## 2017-09-19 ENCOUNTER — Telehealth: Payer: Self-pay

## 2017-09-19 NOTE — Telephone Encounter (Signed)
Patient advised.KW 

## 2017-09-19 NOTE — Telephone Encounter (Signed)
-----   Message from Anola Gurneyobert Chauvin, [place] sent at 09/18/2017 10:31 AM EST ----- Labs look good except for mild cholesterol elevation. Resume daily exercise and check annually.

## 2017-11-23 ENCOUNTER — Other Ambulatory Visit: Payer: Self-pay | Admitting: Family Medicine

## 2017-11-23 ENCOUNTER — Telehealth: Payer: Self-pay | Admitting: Family Medicine

## 2017-11-23 MED ORDER — CLONAZEPAM 0.5 MG PO TABS: 0.5000 mg | ORAL_TABLET | Freq: Two times a day (BID) | ORAL | 5 refills | Status: DC

## 2017-11-23 NOTE — Telephone Encounter (Signed)
done

## 2017-11-23 NOTE — Telephone Encounter (Signed)
Pt needs refill on his clonazepam 0.5 mg  [facility]  Thanks teri

## 2017-11-23 NOTE — Telephone Encounter (Signed)
Please review. Thanks!  

## 2017-12-09 ENCOUNTER — Encounter: Payer: Self-pay | Admitting: Family Medicine

## 2017-12-09 ENCOUNTER — Ambulatory Visit: Payer: BLUE CROSS/BLUE SHIELD | Admitting: Family Medicine

## 2017-12-09 VITALS — BP 122/84 | HR 69 | Temp 98.4°F | Resp 16 | Wt 216.2 lb

## 2017-12-09 DIAGNOSIS — R5383 Other fatigue: Secondary | ICD-10-CM

## 2017-12-09 DIAGNOSIS — M542 Cervicalgia: Secondary | ICD-10-CM | POA: Diagnosis not present

## 2017-12-09 NOTE — Patient Instructions (Signed)
We will call you with the lab results and the scheduling of the ultrasound. Try regular strength aspirin 3-4 x day for neck pan.

## 2017-12-09 NOTE — Progress Notes (Signed)
  Subjective:     Patient ID: Luis Ray, male   DOB: Dec 21, 1965, 52 y.o.   MRN: [id number] Chief Complaint  Patient presents with  . Neck Pain    Patient states the past ten days he has had pain when pressing the left side of his neck. Patent denies any injury or strenous activity, patient states that he is able flex his head up and down, side to side.    HPI Denies any cold sx or recent sore throat. Reports he is not as vigorous as he once was in the gym and notices his erections are not what they once were. No loss of sexual desire. Wishes to check his testosterone levels.  Review of Systems     Objective:   Physical Exam  Constitutional: He appears well-developed and well-nourished. No distress.  Neck: Carotid bruit is not present. Normal range of motion (does not recreate pain to move his neck) present.  Tenderness left neck adjacent to thyroid cartilage. ? nodule       Assessment:    1. Neck pain: ? Thyroid nodule ? Subacute thyroiditis - TSH - T4, free - US THYROID; Future  2. Fatigue, unspecified type - T4, free - Testosterone    Plan:    Discussed trying ASA 4 x day pending lab and ultrasound results. Consider d/c Proscar.

## 2017-12-10 LAB — TESTOSTERONE: Testosterone: 540 ng/dL (ref 264–916)

## 2017-12-10 LAB — TSH: TSH: 1.5 u[IU]/mL (ref 0.450–4.500)

## 2017-12-10 LAB — T4, FREE: FREE T4: 0.99 ng/dL (ref 0.82–1.77)

## 2017-12-12 ENCOUNTER — Telehealth: Payer: Self-pay

## 2017-12-12 NOTE — Telephone Encounter (Signed)
Patient advised.KW 

## 2017-12-12 NOTE — Telephone Encounter (Signed)
-----   Message from Anola Gurney, [place] sent at 12/12/2017  7:39 AM EDT ----- Thyroid and testosterone levels are normal. Proceed with the thyroid ultrasound

## 2017-12-13 ENCOUNTER — Telehealth: Payer: Self-pay | Admitting: Family Medicine

## 2017-12-13 NOTE — Telephone Encounter (Signed)
Spoke with pt wife. She reports that pt neck pain has resolved. They are wanting to know if you think it is ok to wait or would you prefer him go ahead ahead with the ultrasound? Please advise. Thanks.

## 2017-12-13 NOTE — Telephone Encounter (Signed)
Ok to cancel ultrasound if neck pain better.

## 2017-12-13 NOTE — Telephone Encounter (Signed)
Pt's wife Selena Batten (on pt's DPR) is requesting a call back to speak with Nadine Counts or his CMA to discuss why pt should have the thyroid ultrasound if his labs came back normal. Please advise. Thanks TNP

## 2017-12-13 NOTE — Telephone Encounter (Signed)
Wife has been advised. KW 

## 2017-12-14 ENCOUNTER — Telehealth: Payer: Self-pay

## 2017-12-14 NOTE — Telephone Encounter (Signed)
Per Nadine Counts it was okay to cancel if patient had improved. See previous telephone encounter. KW

## 2017-12-14 NOTE — Telephone Encounter (Signed)
Luis Ray Counts are you ok with this test being cancelled ?

## 2017-12-14 NOTE — Telephone Encounter (Signed)
Patient's wife called to see if Luis Ray could cancel his thyroid US appt for tomorrow. Please advise. Thanks!

## 2017-12-15 ENCOUNTER — Ambulatory Visit: Payer: BLUE CROSS/BLUE SHIELD

## 2017-12-15 NOTE — Telephone Encounter (Signed)
Appointment cancelled 12/14/17

## 2018-01-16 ENCOUNTER — Ambulatory Visit: Payer: Self-pay | Admitting: Medical

## 2018-01-16 VITALS — BP 122/79 | HR 66 | Temp 97.8°F | Resp 16 | Wt 219.2 lb

## 2018-01-16 DIAGNOSIS — R05 Cough: Secondary | ICD-10-CM

## 2018-01-16 DIAGNOSIS — R059 Cough, unspecified: Secondary | ICD-10-CM

## 2018-01-16 DIAGNOSIS — J069 Acute upper respiratory infection, unspecified: Secondary | ICD-10-CM

## 2018-01-16 DIAGNOSIS — J0100 Acute maxillary sinusitis, unspecified: Secondary | ICD-10-CM

## 2018-01-16 MED ORDER — AMOXICILLIN-POT CLAVULANATE 875-125 MG PO TABS: 1.0000 | ORAL_TABLET | Freq: Two times a day (BID) | ORAL | 0 refills | Status: DC

## 2018-01-16 NOTE — Progress Notes (Addendum)
Patient called , he is coughing and would like something for cough. Will send in Benzonatate Perles.1-2 every 8 hours as needed for cough  #30 no refills.  Subjective:    Patient ID: Luis Ray, male    DOB: [birth year], 52 y.o.   MRN: [id number]  HPI 52 yo male in non acute distress. Started one week ago felt drained one day last week.  Cough productive green and nasal discharge with green discharge  . Taking  Alka Seltzer cold plus. Denies chest pain or shortness of breath, does feel like he has mucus in his chest.  Has been experiencing dry mouth.    Review of Systems  Constitutional: Negative for chills and fever.  HENT: Positive for congestion, postnasal drip, rhinorrhea, sneezing and sore throat (left side). Negative for ear pain, sinus pressure and sinus pain.   Eyes: Negative for discharge, itching and visual disturbance.  Respiratory: Positive for cough. Negative for shortness of breath.   Cardiovascular: Negative for chest pain.  Gastrointestinal: Negative for diarrhea, nausea and vomiting.  Endocrine: Negative for polydipsia, polyphagia and polyuria.  Genitourinary: Negative for dysuria and hematuria.  Musculoskeletal: Negative for myalgias.  Skin: Negative for rash.  Allergic/Immunologic: Positive for environmental allergies. Negative for food allergies.  Neurological: Negative for dizziness, syncope and light-headedness.  Hematological: Negative for adenopathy.  Psychiatric/Behavioral: Negative for behavioral problems, self-injury and suicidal ideas. The patient is not nervous/anxious.        Objective:   Physical Exam  Constitutional: Vital signs are normal. He appears well-developed and well-nourished.  HENT:  Head: Normocephalic and atraumatic.  Right Ear: Hearing, external ear and ear canal normal. A middle ear effusion is present.  Left Ear: Hearing, external ear and ear canal normal. A middle ear effusion is present.  Nose: Mucosal edema and rhinorrhea present.  Right sinus exhibits no maxillary sinus tenderness and no frontal sinus tenderness. Left sinus exhibits no maxillary sinus tenderness and no frontal sinus tenderness.  Mouth/Throat: Mucous membranes are normal. Uvula swelling present. Posterior oropharyngeal erythema (mild erythema most likely from PND) present. Tonsils are 0 on the right. Tonsils are 0 on the left. No tonsillar exudate.  Eyes: Pupils are equal, round, and reactive to light. Conjunctivae, EOM and lids are normal.  Neck: Trachea normal and normal range of motion. Neck supple. Normal carotid pulses and no JVD present. Carotid bruit is not present. No thyromegaly present.  Cardiovascular: Normal rate, regular rhythm, normal heart sounds and intact distal pulses.  Pulmonary/Chest: Effort normal and breath sounds normal.  Abdominal: Soft. Bowel sounds are normal.  Genitourinary: Rectal exam shows guaiac positive stool (patient declines test).  Musculoskeletal: Normal range of motion.  Lymphadenopathy:    He has no cervical adenopathy.  Neurological: He is alert.  Skin: Skin is warm and dry. Capillary refill takes less than 2 seconds.  Psychiatric: He has a normal mood and affect. His behavior is normal. Judgment and thought content normal.  Nursing note and vitals reviewed.     Clearing throat in room.    Assessment & Plan:  Sinusitis Upper Respiratory Infection Meds ordered this encounter  Medications  . amoxicillin-clavulanate (AUGMENTIN) 875-125 MG tablet    Sig: Take 1 tablet by mouth 2 (two) times daily.    Dispense:  20 tablet    Refill:  0  Take medication with food. Increase water intake. May take OTC Motrin or Tylenol for discomfort and Return in 3-5 days if not improving.  Stop Catering manager for now  Most likely the cause of dry mouth. Restart OTC Flonase take as directed. Try OTC Zyrtec to help dry up post nasal drip.  Patient verbalizes understanidng and has no questions at discharge.

## 2018-01-16 NOTE — Patient Instructions (Signed)
Upper Respiratory Infection, Adult Most upper respiratory infections (URIs) are caused by a virus. A URI affects the nose, throat, and upper air passages. The most common type of URI is often called "the common cold." Follow these instructions at home:  Take medicines only as told by your doctor.  Gargle warm saltwater or take cough drops to comfort your throat as told by your doctor.  Use a warm mist humidifier or inhale steam from a shower to increase air moisture. This may make it easier to breathe.  Drink enough fluid to keep your pee (urine) clear or pale yellow.  Eat soups and other clear broths.  Have a healthy diet.  Rest as needed.  Go back to work when your fever is gone or your doctor says it is okay. ? You may need to stay home longer to avoid giving your URI to others. ? You can also wear a face mask and wash your hands often to prevent spread of the virus.  Use your inhaler more if you have asthma.  Do not use any tobacco products, including cigarettes, chewing tobacco, or electronic cigarettes. If you need help quitting, ask your doctor. Contact a doctor if:  You are getting worse, not better.  Your symptoms are not helped by medicine.  You have chills.  You are getting more short of breath.  You have brown or red mucus.  You have yellow or brown discharge from your nose.  You have pain in your face, especially when you bend forward.  You have a fever.  You have puffy (swollen) neck glands.  You have pain while swallowing.  You have white areas in the back of your throat. Get help right away if:  You have very bad or constant: ? Headache. ? Ear pain. ? Pain in your forehead, behind your eyes, and over your cheekbones (sinus pain). ? Chest pain.  You have long-lasting (chronic) lung disease and any of the following: ? Wheezing. ? Long-lasting cough. ? Coughing up blood. ? A change in your usual mucus.  You have a stiff neck.  You have  changes in your: ? Vision. ? Hearing. ? Thinking. ? Mood. This information is not intended to replace advice given to you by your health care provider. Make sure you discuss any questions you have with your health care provider. Document Released: 01/05/2008 Document Revised: 03/21/2016 Document Reviewed: 10/24/2013 Elsevier Interactive Patient Education  2018 [employer] Sinusitis, Adult Sinusitis is soreness and inflammation of your sinuses. Sinuses are hollow spaces in the bones around your face. They are located:  Around your eyes.  In the middle of your forehead.  Behind your nose.  In your cheekbones.  Your sinuses and nasal passages are lined with a stringy fluid (mucus). Mucus normally drains out of your sinuses. When your nasal tissues get inflamed or swollen, the mucus can get trapped or blocked so air cannot flow through your sinuses. This lets bacteria, viruses, and funguses grow, and that leads to infection. Follow these instructions at home: Medicines  Take, use, or apply over-the-counter and prescription medicines only as told by your doctor. These may include nasal sprays.  If you were prescribed an antibiotic medicine, take it as told by your doctor. Do not stop taking the antibiotic even if you start to feel better. Hydrate and Humidify  Drink enough water to keep your pee (urine) clear or pale yellow.  Use a cool mist humidifier to keep the humidity level in your home above   50%.  Breathe in steam for 10-15 minutes, 3-4 times a day or as told by your doctor. You can do this in the bathroom while a hot shower is running.  Try not to spend time in cool or dry air. Rest  Rest as much as possible.  Sleep with your head raised (elevated).  Make sure to get enough sleep each night. General instructions  Put a warm, moist washcloth on your face 3-4 times a day or as told by your doctor. This will help with discomfort.  Wash your hands often with soap and  water. If there is no soap and water, use hand sanitizer.  Do not smoke. Avoid being around people who are smoking (secondhand smoke).  Keep all follow-up visits as told by your doctor. This is important. Contact a doctor if:  You have a fever.  Your symptoms get worse.  Your symptoms do not get better within 10 days. Get help right away if:  You have a very bad headache.  You cannot stop throwing up (vomiting).  You have pain or swelling around your face or eyes.  You have trouble seeing.  You feel confused.  Your neck is stiff.  You have trouble breathing. This information is not intended to replace advice given to you by your health care provider. Make sure you discuss any questions you have with your health care provider. Document Released: 01/05/2008 Document Revised: 03/14/2016 Document Reviewed: 05/14/2015 Elsevier Interactive Patient Education  2018 [employer]vier Inc.  

## 2018-01-17 MED ORDER — BENZONATATE 100 MG PO CAPS: ORAL_CAPSULE | ORAL | 0 refills | Status: DC

## 2018-01-17 NOTE — Addendum Note (Signed)
Addended by: RATCLIFFE, Herbert SetaHEATHER R on: 01/17/2018 11:57 AM   Modules accepted: Orders

## 2018-01-30 ENCOUNTER — Encounter: Payer: Self-pay | Admitting: Medical

## 2018-01-30 ENCOUNTER — Ambulatory Visit: Payer: Self-pay | Admitting: Medical

## 2018-01-30 VITALS — BP 134/74 | HR 72 | Temp 97.8°F | Resp 16 | Wt 218.2 lb

## 2018-01-30 DIAGNOSIS — J392 Other diseases of pharynx: Secondary | ICD-10-CM

## 2018-01-30 DIAGNOSIS — R0982 Postnasal drip: Secondary | ICD-10-CM

## 2018-01-30 DIAGNOSIS — J309 Allergic rhinitis, unspecified: Secondary | ICD-10-CM

## 2018-01-30 DIAGNOSIS — H6983 Other specified disorders of Eustachian tube, bilateral: Secondary | ICD-10-CM

## 2018-01-30 MED ORDER — FLUTICASONE PROPIONATE 50 MCG/ACT NA SUSP: 2.0000 | Freq: Every day | NASAL | 6 refills | Status: DC

## 2018-01-30 NOTE — Patient Instructions (Signed)
Allergic Rhinitis, Adult Allergic rhinitis is an allergic reaction that affects the mucous membrane inside the nose. It causes sneezing, a runny or stuffy nose, and the feeling of mucus going down the back of the throat (postnasal drip). Allergic rhinitis can be mild to severe. There are two types of allergic rhinitis:  Seasonal. This type is also called hay fever. It happens only during certain seasons.  Perennial. This type can happen at any time of the year.  What are the causes? This condition happens when the body's defense system (immune system) responds to certain harmless substances called allergens as though they were germs.  Seasonal allergic rhinitis is triggered by pollen, which can come from grasses, trees, and weeds. Perennial allergic rhinitis may be caused by:  House dust mites.  Pet dander.  Mold spores.  What are the signs or symptoms? Symptoms of this condition include:  Sneezing.  Runny or stuffy nose (nasal congestion).  Postnasal drip.  Itchy nose.  Tearing of the eyes.  Trouble sleeping.  Daytime sleepiness.  How is this diagnosed? This condition may be diagnosed based on:  Your medical history.  A physical exam.  Tests to check for related conditions, such as: ? Asthma. ? Pink eye. ? Ear infection. ? Upper respiratory infection.  Tests to find out which allergens trigger your symptoms. These may include skin or blood tests.  How is this treated? There is no cure for this condition, but treatment can help control symptoms. Treatment may include:  Taking medicines that block allergy symptoms, such as antihistamines. Medicine may be given as a shot, nasal spray, or pill.  Avoiding the allergen.  Desensitization. This treatment involves getting ongoing shots until your body becomes less sensitive to the allergen. This treatment may be done if other treatments do not help.  If taking medicine and avoiding the allergen does not work, new,  stronger medicines may be prescribed.  Follow these instructions at home:  Find out what you are allergic to. Common allergens include smoke, dust, and pollen.  Avoid the things you are allergic to. These are some things you can do to help avoid allergens: ? Replace carpet with wood, tile, or vinyl flooring. Carpet can trap dander and dust. ? Do not smoke. Do not allow smoking in your home. ? Change your heating and air conditioning filter at least once a month. ? During allergy season:  Keep windows closed as much as possible.  Plan outdoor activities when pollen counts are lowest. This is usually during the evening hours.  When coming indoors, change clothing and shower before sitting on furniture or bedding.  Take over-the-counter and prescription medicines only as told by your health care provider.  Keep all follow-up visits as told by your health care provider. This is important. Contact a health care provider if:  You have a fever.  You develop a persistent cough.  You make whistling sounds when you breathe (you wheeze).  Your symptoms interfere with your normal daily activities. Get help right away if:  You have shortness of breath. Summary  This condition can be managed by taking medicines as directed and avoiding allergens.  Contact your health care provider if you develop a persistent cough or fever.  During allergy season, keep windows closed as much as possible. This information is not intended to replace advice given to you by your health care provider. Make sure you discuss any questions you have with your health care provider. Document Released: 04/13/2001 Document Revised: 08/26/2016  Document Reviewed: 08/26/2016 Elsevier Interactive Patient Education  2018 [employer] Postnasal Drip Postnasal drip is the feeling of mucus going down the back of your throat. Mucus is a slimy substance that moistens and cleans your nose and throat, as well as the air  pockets in face bones near your forehead and cheeks (sinuses). Small amounts of mucus pass from your nose and sinuses down the back of your throat all the time. This is normal. When you produce too much mucus or the mucus gets too thick, you can feel it. Some common causes of postnasal drip include:  Having more mucus because of: ? A cold or the flu. ? Allergies. ? Cold air. ? Certain medicines.  Having more mucus that is thicker because of: ? A sinus or nasal infection. ? Dry air. ? A food allergy.  Follow these instructions at home: Relieving discomfort  Gargle with a salt-water mixture 3-4 times a day or as needed. To make a salt-water mixture, completely dissolve -1 tsp of salt in 1 cup of warm water.  If the air in your home is dry, use a humidifier to add moisture to the air.  Use a saline spray or container (neti pot) to flush out the nose (nasal irrigation). These methods can help clear away mucus and keep the nasal passages moist. General instructions  Take over-the-counter and prescription medicines only as told by your health care provider.  Follow instructions from your health care provider about eating or drinking restrictions. You may need to avoid caffeine.  Avoid things that you know you are allergic to (allergens), like dust, mold, pollen, pets, or certain foods.  Drink enough fluid to keep your urine pale yellow.  Keep all follow-up visits as told by your health care provider. This is important. Contact a health care provider if:  You have a fever.  You have a sore throat.  You have difficulty swallowing.  You have headache.  You have sinus pain.  You have a cough that does not go away.  The mucus from your nose becomes thick and is green or yellow in color.  You have cold or flu symptoms that last more than 10 days. Summary  Postnasal drip is the feeling of mucus going down the back of your throat.  If your health care provider approves, use  nasal irrigation or a nasal spray 2?4 times a day.  Avoid things that you know you are allergic to (allergens), like dust, mold, pollen, pets, or certain foods. This information is not intended to replace advice given to you by your health care provider. Make sure you discuss any questions you have with your health care provider. Document Released: 11/01/2016 Document Revised: 11/01/2016 Document Reviewed: 11/01/2016 Elsevier Interactive Patient Education  2018 [employer] Eustachian Tube Dysfunction The eustachian tube connects the middle ear to the back of the nose. It regulates air pressure in the middle ear by allowing air to move between the ear and nose. It also helps to drain fluid from the middle ear space. When the eustachian tube does not function properly, air pressure, fluid, or both can build up in the middle ear. Eustachian tube dysfunction can affect one or both ears. What are the causes? This condition happens when the eustachian tube becomes blocked or cannot open normally. This may result from:  Ear infections.  Colds and other upper respiratory infections.  Allergies.  Irritation, such as from cigarette smoke or acid from the stomach coming up into the esophagus (  gastroesophageal reflux).  Sudden changes in air pressure, such as from descending in an airplane.  Abnormal growths in the nose or throat, such as nasal polyps, tumors, or enlarged tissue at the back of the throat (adenoids).  What increases the risk? This condition may be more likely to develop in people who smoke and people who are overweight. Eustachian tube dysfunction may also be more likely to develop in children, especially children who have:  Certain birth defects of the mouth, such as cleft palate.  Large tonsils and adenoids.  What are the signs or symptoms? Symptoms of this condition may include:  A feeling of fullness in the ear.  Ear pain.  Clicking or popping noises in the  ear.  Ringing in the ear.  Hearing loss.  Loss of balance.  Symptoms may get worse when the air pressure around you changes, such as when you travel to an area of high elevation or fly on an airplane. How is this diagnosed? This condition may be diagnosed based on:  Your symptoms.  A physical exam of your ear, nose, and throat.  Tests, such as those that measure: ? The movement of your eardrum (tympanogram). ? Your hearing (audiometry).  How is this treated? Treatment depends on the cause and severity of your condition. If your symptoms are mild, you may be able to relieve your symptoms by moving air into ("popping") your ears. If you have symptoms of fluid in your ears, treatment may include:  Decongestants.  Antihistamines.  Nasal sprays or ear drops that contain medicines that reduce swelling (steroids).  In some cases, you may need to have a procedure to drain the fluid in your eardrum (myringotomy). In this procedure, a small tube is placed in the eardrum to:  Drain the fluid.  Restore the air in the middle ear space.  Follow these instructions at home:  Take over-the-counter and prescription medicines only as told by your health care provider.  Use techniques to help pop your ears as recommended by your health care provider. These may include: ? Chewing gum. ? Yawning. ? Frequent, forceful swallowing. ? Closing your mouth, holding your nose closed, and gently blowing as if you are trying to blow air out of your nose.  Do not do any of the following until your health care provider approves: ? Travel to high altitudes. ? Fly in airplanes. ? Work in a Estate agent or room. ? Scuba dive.  Keep your ears dry. Dry your ears completely after showering or bathing.  Do not smoke.  Keep all follow-up visits as told by your health care provider. This is important. Contact a health care provider if:  Your symptoms do not go away after treatment.  Your  symptoms come back after treatment.  You are unable to pop your ears.  You have: ? A fever. ? Pain in your ear. ? Pain in your head or neck. ? Fluid draining from your ear.  Your hearing suddenly changes.  You become very dizzy.  You lose your balance. This information is not intended to replace advice given to you by your health care provider. Make sure you discuss any questions you have with your health care provider. Document Released: 08/15/2015 Document Revised: 12/25/2015 Document Reviewed: 08/07/2014 Elsevier Interactive Patient Education  [employer].

## 2018-01-30 NOTE — Progress Notes (Signed)
Subjective:    Patient ID: Luis Ray, male    DOB: 06-May-1966, 52 y.o.   MRN: [id number]  HPI 52 yo male in non acute distress.Feels better since sinusitis on 01/16/18 after being treated with Augmentin. I have a runny nose in the morning , all discharge is clear". Sore on left side of neck but when putting on his seat belt on Saturday he felt it as he turned his head., no pain on movement now , can feel it with swallowing on the outside of the skin. Did have similar symptom of neck pain when he was seen last in clinic for sinusitis he noticed it/ Took psuedofed this morning,  which also seemed to help. He googled his neck pain and he is worried about throat cancer.   Blood pressure 134/74, pulse 72, temperature 97.8 F (36.6 C), temperature source Tympanic, resp. rate 16, weight 218 lb 3.2 oz (99 kg), SpO2 98 %.  Review of Systems  Constitutional: Negative for appetite change, chills and fever.  HENT: Positive for postnasal drip, rhinorrhea and sore throat (no pain with swallowing but can feel neck pain on the left side outside on the neck). Negative for congestion, ear pain, trouble swallowing ( and no pain with eating) and voice change.   Eyes: Negative for photophobia and visual disturbance.  Respiratory: Positive for cough (mild). Negative for shortness of breath and wheezing.   Cardiovascular: Negative for chest pain.  Genitourinary: Negative for dysuria.  Musculoskeletal: Positive for neck pain. Negative for neck stiffness.  Skin: Negative for rash.  Allergic/Immunologic: Positive for environmental allergies. Negative for food allergies.  Neurological: Positive for headaches (at the temples , feels better with massage).  Hematological: Negative for adenopathy.   History of turbinate reduction twice  5 years ago. History of turbinate reducation and deviated septum repair by Dr. Elenore Rota.    Objective:   Physical Exam  Constitutional: He appears well-developed and  well-nourished.  HENT:  Head: Normocephalic and atraumatic.  Right Ear: Hearing and ear canal normal. A middle ear effusion is present.  Left Ear: Hearing and ear canal normal. A middle ear effusion is present.  Mouth/Throat: Uvula is midline and mucous membranes are normal. Oral lesions present. Tonsils are 1+ on the right. Tonsils are 1+ on the left.    Eyes: Pupils are equal, round, and reactive to light. EOM are normal.  Neck: Neck supple.  Cardiovascular: Normal rate, regular rhythm and normal heart sounds.  Pulmonary/Chest: Effort normal.  Lymphadenopathy:    He has cervical adenopathy.  Neurological: He is alert.  Skin: Skin is warm and dry.  Psychiatric: He has a normal mood and affect. His behavior is normal.  Nursing note and vitals reviewed.  Clearing throat in room.   Noted on the back of oropharynx is a spot on the right side that is raised size of 1/2 pea.. Rhinitis on the right and vascular side.turbinate edema on the left side.    Assessment & Plan:  Eustachian tube dysfunction, Post nasal drip, Allergic Rhinitis, lesion on right side of throat. Left sided cervical node. Flonase take as directed daily. Prescription done. Meds ordered this encounter  Medications  . fluticasone (FLONASE) 50 MCG/ACT nasal spray    Sig: Place 2 sprays into both nostrils daily.    Dispense:  16 g    Refill:  6   Taking Zyrtec ( swithes up to Claritin after bottle is done,  He alternates) Return to the clinic in 7-10 days for  recheck of throat right side and node on left side. Reviewed wiith patient too not push on lymph node in neck.  Patient verbalizes understanding and has no questions at discharge.

## 2018-02-06 ENCOUNTER — Encounter: Payer: Self-pay | Admitting: Medical

## 2018-02-06 ENCOUNTER — Ambulatory Visit: Payer: Self-pay | Admitting: Medical

## 2018-02-06 VITALS — BP 136/92 | HR 69 | Temp 97.1°F | Resp 16 | Wt 217.2 lb

## 2018-02-06 DIAGNOSIS — M542 Cervicalgia: Secondary | ICD-10-CM

## 2018-02-06 DIAGNOSIS — B001 Herpesviral vesicular dermatitis: Secondary | ICD-10-CM

## 2018-02-06 NOTE — Patient Instructions (Signed)
Cold Sore A cold sore, also called a fever blister, is a skin infection that is caused by a virus. This infection causes small, fluid-filled sores to form inside of the mouth or on the lips, gums, nose, chin, or cheeks. Cold sores can spread to other parts of the body, such as the eyes or fingers. Cold sores can be spread or passed from person to person (contagious) until the sores crust over completely. Cold sores can be spread through close contact, such as kissing or sharing a drinking glass. Follow these instructions at home: Medicines  Take or apply over-the-counter and prescription medicines only as told by your doctor.  Use a cotton-tip swab to apply creams or gels to your sores. Sore Care  Do not touch the sores or pick the scabs.  Wash your hands often. Do not touch your eyes without washing your hands first.  Keep the sores clean and dry.  If directed, apply ice to the sores:  Put ice in a plastic bag.  Place a towel between your skin and the bag.  Leave the ice on for 20 minutes, 2-3 times per day. Lifestyle  Do not kiss, have oral sex, or share personal items until your sores heal.  Eat a soft, bland diet. Avoid eating hot, cold, or salty foods. These can hurt your mouth.  Use a straw if it hurts to drink out of a glass.  Avoid the sun and limit your stress if these things trigger outbreaks. If sun causes cold sores, apply sunscreen on your lips before being out in the sun. Contact a doctor if:  You have symptoms for more than two weeks.  You have pus coming from the sores.  You have redness that is spreading.  You have pain or irritation in your eye.  You get sores on your genitals.  Your sores do not heal within two weeks.  You get cold sores often. Get help right away if:  You have a fever and your symptoms suddenly get worse.  You have a headache and confusion. This information is not intended to replace advice given to you by your health care  provider. Make sure you discuss any questions you have with your health care provider. Document Released: 01/18/2012 Document Revised: 12/25/2015 Document Reviewed: 05/09/2015 Elsevier Interactive Patient Education  2018 [employer]  

## 2018-02-06 NOTE — Progress Notes (Signed)
Subjective:    Patient ID: Luis Ray, male    DOB: January 17, 1966, 52 y.o.   MRN: [id number]  HPI 52 yo male in non acute distress. Returns for recheck of neck pain on the left which felt like a lymph node the last time I saw patient on 01/30/18.  "The pain  comes and goes still".   And to recheck lesion on the right side of throat and Eustachian Tube Dysfunction bilaterally. Today he has no pain but can still feel the area. "sometimes it hurts , sometimes it does not hurt."  Taking Zyrtec, Sudafed and Flonase (Using Flonase twice a day, cannot tell a difference when using it tiwce daily).  Has a cold sore that is new, located on center of upper lip started feeling it on Friday, then sore appeared he took his Valtrex as prescribed.  No pain in neck  today with turning head, no pain with swallowing , or eating or putting his seat belt today .   Sinus headache started Saturday, "everyone in my house has had stomach virus with diarrhea and  vomiting"  "I am good, no symptoms".    He leaves Wednesday for [facility] returns on Saturday.   "I am worried the area in my neck is cancer." Previously seen by Dr. Elenore Rota for sinus sugery.   Blood pressure (!) 136/92, pulse 69, temperature (!) 97.1 F (36.2 C), temperature source Tympanic, resp. rate 16, weight 217 lb 3.2 oz (98.5 kg), SpO2 98 %.  Review of Systems  Constitutional: Negative.   HENT: Positive for mouth sores (upper lip sore, started Friday) and sinus pressure (forehead). Negative for congestion, ear discharge, ear pain and sore throat.        Hearing cracking in the ear on the right side  Eyes: Negative for discharge, itching and visual disturbance.  Respiratory: Positive for cough ("little bit"). Negative for shortness of breath and wheezing.   Cardiovascular: Negative for chest pain, palpitations and leg swelling.  Gastrointestinal: Negative for abdominal pain, diarrhea, nausea and vomiting.  Endocrine: Negative for polydipsia,  polyphagia and polyuria.  Genitourinary: Negative for dysuria.  Musculoskeletal: Positive for neck pain (left side "i can pinpoint the site" ) and neck stiffness ("i feel some tension"). Negative for myalgias.  Skin: Positive for wound (sore on upper lip). Negative for rash.  Allergic/Immunologic: Positive for environmental allergies. Negative for food allergies.  Neurological: Positive for headaches. Negative for dizziness, syncope, weakness and light-headedness.  Hematological: Positive for adenopathy (left side of neck).  Psychiatric/Behavioral: Negative for agitation, behavioral problems, confusion, hallucinations, self-injury, sleep disturbance and suicidal ideas.       Objective:   Physical Exam  Constitutional: He is oriented to person, place, and time. He appears well-developed and well-nourished.  HENT:  Head: Normocephalic and atraumatic.  Right Ear: Hearing, external ear and ear canal normal. A middle ear effusion is present.  Left Ear: Hearing, external ear and ear canal normal. A middle ear effusion is present.  Nose: Mucosal edema (right side) present.  Mouth/Throat: Uvula is midline and mucous membranes are normal.    Eyes: Pupils are equal, round, and reactive to light. Conjunctivae and EOM are normal.  Neck: Normal range of motion. Neck supple.  Can flex neck and touch chest  Cardiovascular: Normal rate, regular rhythm and normal heart sounds.  Pulmonary/Chest: Effort normal and breath sounds normal.  Musculoskeletal: Normal range of motion.  Lymphadenopathy:    He has no cervical adenopathy (can pinpoint tenderness on the  left side of neck.).  Neurological: He is alert and oriented to person, place, and time.  Skin: Skin is warm and dry.  Psychiatric: He has a normal mood and affect. His behavior is normal. Judgment and thought content normal.  Nursing note and vitals reviewed.    Lesion on right of posterior pharynx ,appears flatter and  smaller than the last  visit. Apperars more lateral today ( see diagram). Patient states he cannot feel it. The lesion is round  And  smooth looking. Diagram also shows a u7small crusted sore on the central part of the upper lip.     Assessment & Plan:  Pain of left side of neck possibly a lymph node or nodule., ETD bilaterally,  Cold Sore noted on central upper lip. Lesion on the right side of posterior pharynx (smaller then when seen on  01/30/18).  Will refer to his ENT for further evaluation. Seen before and has had sinus surgery by Dr. Elenore RotaJuengel. To only use Flonase once a day as prescribed.  Did reviewed elevated blood pressure with patient , to follow up with his doctor for recheck. It may be he is nervous about coming to clinic today.  Patient verbalizes understanding and has no questions at discharge.  In reviewing the  Chart an Thyroid ultrasound was ordered 12/09/2017 ? Thyroid nodule, his pain got better so the ultrasound was cancelled.TSH 1.500 T4(Direct) 0.99 results from 12/09/17 .

## 2018-02-28 DIAGNOSIS — M542 Cervicalgia: Secondary | ICD-10-CM | POA: Diagnosis not present

## 2018-02-28 DIAGNOSIS — R065 Mouth breathing: Secondary | ICD-10-CM | POA: Diagnosis not present

## 2018-02-28 DIAGNOSIS — G4733 Obstructive sleep apnea (adult) (pediatric): Secondary | ICD-10-CM | POA: Diagnosis not present

## 2018-03-25 ENCOUNTER — Other Ambulatory Visit: Payer: Self-pay | Admitting: Family Medicine

## 2018-06-19 ENCOUNTER — Other Ambulatory Visit: Payer: Self-pay | Admitting: Family Medicine

## 2018-06-26 ENCOUNTER — Ambulatory Visit: Payer: Self-pay | Admitting: Medical

## 2018-06-26 ENCOUNTER — Encounter: Payer: Self-pay | Admitting: Medical

## 2018-06-26 VITALS — BP 126/89 | HR 68 | Temp 97.8°F | Resp 16 | Wt 217.4 lb

## 2018-06-26 DIAGNOSIS — H6983 Other specified disorders of Eustachian tube, bilateral: Secondary | ICD-10-CM

## 2018-06-26 DIAGNOSIS — B349 Viral infection, unspecified: Secondary | ICD-10-CM

## 2018-06-26 NOTE — Patient Instructions (Signed)
Viral Illness, Adult Viruses are tiny germs that can get into a person's body and cause illness. There are many different types of viruses, and they cause many types of illness. Viral illnesses can range from mild to severe. They can affect various parts of the body. Common illnesses that are caused by a virus include colds and the flu. Viral illnesses also include serious conditions such as HIV/AIDS (human immunodeficiency virus/acquired immunodeficiency syndrome). A few viruses have been linked to certain cancers. What are the causes? Many types of viruses can cause illness. Viruses invade cells in your body, multiply, and cause the infected cells to malfunction or die. When the cell dies, it releases more of the virus. When this happens, you develop symptoms of the illness, and the virus continues to spread to other cells. If the virus takes over the function of the cell, it can cause the cell to divide and grow out of control, as is the case when a virus causes cancer. Different viruses get into the body in different ways. You can get a virus by:  Swallowing food or water that is contaminated with the virus.  Breathing in droplets that have been coughed or sneezed into the air by an infected person.  Touching a surface that has been contaminated with the virus and then touching your eyes, nose, or mouth.  Being bitten by an insect or animal that carries the virus.  Having sexual contact with a person who is infected with the virus.  Being exposed to blood or fluids that contain the virus, either through an open cut or during a transfusion.  If a virus enters your body, your body's defense system (immune system) will try to fight the virus. You may be at higher risk for a viral illness if your immune system is weak. What are the signs or symptoms? Symptoms vary depending on the type of virus and the location of the cells that it invades. Common symptoms of the main types of viral illnesses  include: Cold and flu viruses  Fever.  Headache.  Sore throat.  Muscle aches.  Nasal congestion.  Cough. Digestive system (gastrointestinal) viruses  Fever.  Abdominal pain.  Nausea.  Diarrhea. Liver viruses (hepatitis)  Loss of appetite.  Tiredness.  Yellowing of the skin (jaundice). Brain and spinal cord viruses  Fever.  Headache.  Stiff neck.  Nausea and vomiting.  Confusion or sleepiness. Skin viruses  Warts.  Itching.  Rash. Sexually transmitted viruses  Discharge.  Swelling.  Redness.  Rash. How is this treated? Viruses can be difficult to treat because they live within cells. Antibiotic medicines do not treat viruses because these drugs do not get inside cells. Treatment for a viral illness may include:  Resting and drinking plenty of fluids.  Medicines to relieve symptoms. These can include over-the-counter medicine for pain and fever, medicines for cough or congestion, and medicines to relieve diarrhea.  Antiviral medicines. These drugs are available only for certain types of viruses. They may help reduce flu symptoms if taken early. There are also many antiviral medicines for hepatitis and HIV/AIDS.  Some viral illnesses can be prevented with vaccinations. A common example is the flu shot. Follow these instructions at home: Medicines   Take over-the-counter and prescription medicines only as told by your health care provider.  If you were prescribed an antiviral medicine, take it as told by your health care provider. Do not stop taking the medicine even if you start to feel better.  Be aware   of when antibiotics are needed and when they are not needed. Antibiotics do not treat viruses. If your health care provider thinks that you may have a bacterial infection as well as a viral infection, you may get an antibiotic. ? Do not ask for an antibiotic prescription if you have been diagnosed with a viral illness. That will not make your  illness go away faster. ? Frequently taking antibiotics when they are not needed can lead to antibiotic resistance. When this develops, the medicine no longer works against the bacteria that it normally fights. General instructions  Drink enough fluids to keep your urine clear or pale yellow.  Rest as much as possible.  Return to your normal activities as told by your health care provider. Ask your health care provider what activities are safe for you.  Keep all follow-up visits as told by your health care provider. This is important. How is this prevented? Take these actions to reduce your risk of viral infection:  Eat a healthy diet and get enough rest.  Wash your hands often with soap and water. This is especially important when you are in public places. If soap and water are not available, use hand sanitizer.  Avoid close contact with friends and family who have a viral illness.  If you travel to areas where viral gastrointestinal infection is common, avoid drinking water or eating raw food.  Keep your immunizations up to date. Get a flu shot every year as told by your health care provider.  Do not share toothbrushes, nail clippers, razors, or needles with other people.  Always practice safe sex.  Contact a health care provider if:  You have symptoms of a viral illness that do not go away.  Your symptoms come back after going away.  Your symptoms get worse. Get help right away if:  You have trouble breathing.  You have a severe headache or a stiff neck.  You have severe vomiting or abdominal pain. This information is not intended to replace advice given to you by your health care provider. Make sure you discuss any questions you have with your health care provider. Document Released: 11/28/2015 Document Revised: 12/31/2015 Document Reviewed: 11/28/2015 Elsevier Interactive Patient Education  2018 Elsevier Inc.  

## 2018-06-26 NOTE — Progress Notes (Signed)
   Subjective:    Patient ID: Luis Ray, male    DOB: 05/26/1966, 52 y.o.   MRN: [id number]  HPI 52 yo male in non acute distress.   On Flonase and Zyrtec daily.  Cough and cold symptoms ( nasal congestion and runny nose clear. and r and can hear fluid in his ears). Cough  Clear.  Denies fever or chills. Started Saturday.No cough now.  Wife wanted him  checked before the holiday.taking Sudafed for nasal congestion.   Blood pressure 126/89, pulse 68, temperature 97.8 F (36.6 C), temperature source Tympanic, resp. rate 16, weight 217 lb 6.4 oz (98.6 kg), SpO2 98 %.  Review of Systems  Constitutional: Negative for chills and fever.  HENT: Positive for postnasal drip and rhinorrhea. Negative for ear discharge, ear pain, nosebleeds, sinus pressure, sinus pain, sneezing and sore throat.   Eyes: Negative for discharge and itching.  Respiratory: Positive for cough. Negative for chest tightness, shortness of breath and wheezing.   Cardiovascular: Negative for chest pain, palpitations and leg swelling.  Gastrointestinal: Negative for abdominal pain.  Endocrine: Negative for polydipsia, polyphagia and polyuria.  Genitourinary: Negative for dysuria.  Musculoskeletal: Negative for myalgias.  Skin: Negative for rash.  Allergic/Immunologic: Positive for environmental allergies. Negative for food allergies.  Neurological: Negative for dizziness, syncope, light-headedness and headaches.  Hematological: Negative for adenopathy.  Psychiatric/Behavioral: Negative for behavioral problems, confusion, self-injury and suicidal ideas.       Objective:   Physical Exam  Constitutional: He is oriented to person, place, and time. He appears well-nourished.  HENT:  Head: Normocephalic and atraumatic.  Right Ear: Hearing, external ear and ear canal normal. A middle ear effusion is present.  Left Ear: Hearing, external ear and ear canal normal. A middle ear effusion is present.  Nose: Rhinorrhea (clear)  present.  Mouth/Throat: Uvula is midline, oropharynx is clear and moist and mucous membranes are normal. Tonsils are 2+ on the right. Tonsils are 2+ on the left.  Eyes: Pupils are equal, round, and reactive to light. Conjunctivae and EOM are normal.  Neck: Normal range of motion. Neck supple.  Cardiovascular: Normal rate, regular rhythm and normal heart sounds.  Pulmonary/Chest: Effort normal and breath sounds normal.  Lymphadenopathy:    He has no cervical adenopathy.  Neurological: He is alert and oriented to person, place, and time.  Skin: Skin is warm and dry.  Psychiatric: He has a normal mood and affect. His behavior is normal. Judgment and thought content normal.  Nursing note and vitals reviewed.    Clearing throat in room     Assessment & Plan:  Viral illness.ETD bilaterally Rest , increase fluids, OTC Motrin or Tylenol if not feeling well. Can take Mucinex DM as directed for congestion and cough if needed. If  Fever 100.5 or higher, yellow/ green discharge to call clinic and I will call him in an antibiotic. He verbalizes understanding and has no  Questions at discharge.  Return to the clinic as needed.

## 2018-07-04 ENCOUNTER — Other Ambulatory Visit: Payer: Self-pay | Admitting: Medical

## 2018-09-21 DIAGNOSIS — X32XXXA Exposure to sunlight, initial encounter: Secondary | ICD-10-CM | POA: Diagnosis not present

## 2018-09-21 DIAGNOSIS — D485 Neoplasm of uncertain behavior of skin: Secondary | ICD-10-CM | POA: Diagnosis not present

## 2018-09-21 DIAGNOSIS — L814 Other melanin hyperpigmentation: Secondary | ICD-10-CM | POA: Diagnosis not present

## 2018-09-21 DIAGNOSIS — D2239 Melanocytic nevi of other parts of face: Secondary | ICD-10-CM | POA: Diagnosis not present

## 2018-09-21 DIAGNOSIS — L718 Other rosacea: Secondary | ICD-10-CM | POA: Diagnosis not present

## 2018-11-20 ENCOUNTER — Other Ambulatory Visit: Payer: Self-pay | Admitting: Medical

## 2018-11-20 NOTE — Telephone Encounter (Signed)
Called and spoke with pharmacy staff verbal order taken for his flonase Rx since Epic timed out prior to transmission completed fluticasone nasal spray 1 spray each nostril BID #48 gm RF0.

## 2019-01-04 ENCOUNTER — Other Ambulatory Visit: Payer: Self-pay

## 2019-01-04 ENCOUNTER — Encounter: Payer: Self-pay | Admitting: Physician Assistant

## 2019-01-04 ENCOUNTER — Ambulatory Visit: Payer: BC Managed Care – PPO | Admitting: Physician Assistant

## 2019-01-04 VITALS — BP 123/81 | HR 63 | Temp 98.0°F | Resp 16 | Ht 70.0 in | Wt 213.0 lb

## 2019-01-04 DIAGNOSIS — Z131 Encounter for screening for diabetes mellitus: Secondary | ICD-10-CM

## 2019-01-04 DIAGNOSIS — Z114 Encounter for screening for human immunodeficiency virus [HIV]: Secondary | ICD-10-CM

## 2019-01-04 DIAGNOSIS — Z130 Encounter for screening for diseases of the blood and blood-forming organs and certain disorders involving the immune mechanism: Secondary | ICD-10-CM | POA: Diagnosis not present

## 2019-01-04 DIAGNOSIS — R0789 Other chest pain: Secondary | ICD-10-CM

## 2019-01-04 DIAGNOSIS — Z1329 Encounter for screening for other suspected endocrine disorder: Secondary | ICD-10-CM

## 2019-01-04 DIAGNOSIS — F419 Anxiety disorder, unspecified: Secondary | ICD-10-CM

## 2019-01-04 DIAGNOSIS — Z125 Encounter for screening for malignant neoplasm of prostate: Secondary | ICD-10-CM

## 2019-01-04 DIAGNOSIS — E785 Hyperlipidemia, unspecified: Secondary | ICD-10-CM | POA: Diagnosis not present

## 2019-01-04 NOTE — Progress Notes (Signed)
Patient: Luis Ray Male    DOB: 1966-05-29   53 y.o.   MRN: [id number] Visit Date: 01/04/2019  Today's Provider: [name], PA-C   Chief Complaint  Patient presents with  . Chest Pain  . Arm Pain   Subjective:     HPI   Patient presents today with chest tightness upon waking this morning that has since resolved. He reports for several days prior he has been waking up with left arm numbness that resolves spontaneously. He denies the chest tightness and arm numbness occurring together. He denies chest pain, nausea, diaphoresis, vomiting, or SOB. His father had an MI in his mid 54's. The patient denies smoking.  Patient previously saw Toni Arthurs, PA-C who has since retired. Currently takes finasteride for hair growth, PRN valtrex for cold sores, zyrtec for allergies. Takes klonopin 0.5 mg in the morning and then occasionally a second dose at night when he remembers. He has never tried anything else for anxiety. Cites his job in [employer] as a contributor to his stress level.   Allergies  Allergen Reactions  . Sulfa Antibiotics Other (See Comments)    Upset stomach     Current Outpatient Medications:  .  cetirizine (ZYRTEC) 10 MG tablet, Take 10 mg by mouth daily., Disp: , Rfl:  .  clonazePAM (KLONOPIN) 0.5 MG tablet, TAKE 1 TABLET (0.5 MG TOTAL) BY MOUTH 2 (TWO) TIMES DAILY., Disp: 60 tablet, Rfl: 5 .  finasteride (PROSCAR) 5 MG tablet, TAKE 1/4 TABLET BY MOUTH DAILY AS DIRECTED BY PHYSICIAN., Disp: 30 tablet, Rfl: 3 .  fluticasone (FLONASE) 50 MCG/ACT nasal spray, Place 1 spray into both nostrils 2 (two) times daily., Disp: 48 g, Rfl: 0 .  pseudoephedrine (SUDAFED) 60 MG tablet, Take 60 mg by mouth every 4 (four) hours as needed for congestion., Disp: , Rfl:   Review of Systems  Constitutional: Negative.   Respiratory: Positive for chest tightness. Negative for cough and shortness of breath.   Cardiovascular: Negative.   Neurological: Positive for numbness. Negative  for headaches.    Social History   Tobacco Use  . Smoking status: Never Smoker  . Smokeless tobacco: Never Used  Substance Use Topics  . Alcohol use: Yes    Alcohol/week: 0.0 standard drinks    Comment: rare      Objective:   BP 123/81 (BP Location: Right Arm, Patient Position: Sitting, Cuff Size: Large)   Pulse 63   Temp 98 F (36.7 C) (Oral)   Resp 16   Ht 5\' 10"  (1.778 m)   Wt 213 lb (96.6 kg)   BMI 30.56 kg/m  Vitals:   01/04/19 1020  BP: 123/81  Pulse: 63  Resp: 16  Temp: 98 F (36.7 C)  TempSrc: Oral  Weight: 213 lb (96.6 kg)  Height: 5\' 10"  (1.778 m)     Physical Exam Constitutional:      General: He is not in acute distress.    Appearance: He is well-developed. He is not ill-appearing or diaphoretic.  Cardiovascular:     Rate and Rhythm: Normal rate and regular rhythm.     Heart sounds: Normal heart sounds. No murmur.  Pulmonary:     Effort: Pulmonary effort is normal. No tachypnea or respiratory distress.     Breath sounds: Normal breath sounds.  Skin:    General: Skin is warm and dry.  Neurological:     Mental Status: He is alert and oriented to person, place, and  time.  Psychiatric:        Mood and Affect: Mood normal.        Behavior: Behavior normal.         Assessment & Plan    1. Chest tightness  His EKG is benign today and comparable to prior. Have reassured patient that I do not think his symptoms are due to his heart. It sounds more likely that his arm numbness is due to nerve dompression.   2. Encounter for screening for HIV  - HIV antibody (with reflex)  3. Hyperlipidemia, unspecified hyperlipidemia type  - Lipid Profile  4. Thyroid disorder screening  - TSH  5. Diabetes mellitus screening  - Comprehensive Metabolic Panel (CMET)  6. Screening for deficiency anemia  - CBC with Differential  7. Prostate cancer screening  - PSA  8. Chronic anxiety  Counseled about how chronic long term use of benzodiazepines  is not recommended due to increased risk of dementia, respiratory depression, falls, dependence and addiction. We will see him back at his physical exam and discuss alternative therapies.   The entirety of the information documented in the History of Present Illness, Review of Systems and Physical Exam were personally obtained by me. Portions of this information were initially documented by Rondel BatonSulibeya Dimas, CMA and reviewed by me for thoroughness and accuracy.   F/u 1-2 months for CPE  I have spent 25 minutes with this patient, >50% of which was spent on counseling and coordination of care.     Trey SailorsAdriana M Pollak, PA-C  [facility]

## 2019-01-04 NOTE — Patient Instructions (Signed)
Angina ° °Angina is extreme discomfort in the chest, neck, arm, jaw or back. The discomfort is caused by a lack of blood in the middle layer of the heart wall (myocardium). °There are four types of angina: °· Stable angina. This is triggered by vigorous activity or exercise. It goes away when you rest or take angina medicine. °· Unstable angina. This is a warning sign and can lead to a heart attack (acute coronary syndrome). This is a medical emergency. Symptoms come at rest and last a long time. °· Microvascular angina. This affects the small coronary arteries. Symptoms include feeling tired and being short of breath. °· Prinzmetal or variant angina. This is caused by a tightening (spasm) of the arteries that go to your heart. °What are the causes? °This condition is caused by atherosclerosis. This is the buildup of fat and cholesterol (plaque) in your arteries. The plaque may narrow or block the artery. °Other causes include: °· Sudden tightening of the muscles of the arteries in the heart (coronary spasm). °· Small artery disease (microvascular dysfunction). °· Problems with any of your heart valves (heart valve disease). °· A tear in an artery in your heart (coronary artery dissection). °· Cardiomyopathy, or other heart disease. °What increases the risk? °You are more likely to develop this condition if you have: °· High cholesterol. °· High blood pressure. °· Diabetes. °· Family history of heart disease. °· Inactive (sedentary) lifestyle, or you do not exercise enough. °· Depression. °· Had radiation to the left side of your chest. °Other risk factors include: °· Using tobacco. °· Being obese. °· Eating a diet high in saturated fats. °· Being exposed to high stress or triggers of stress. °· Using drugs, such as cocaine. °Women have a greater risk for angina if: °· They are older than 55. °· They have gone through menopause (postmenopausal). °What are the signs or symptoms? °Common symptoms in both men and women  may include: °· Chest pain, which may: °? Feel like a crushing or squeezing in the chest, or a tightness, pressure, fullness, or heaviness in the chest. °? Last for more than a few minutes at a time, or it may stop and come back (recur) over the course of a few minutes. °· Pain in the neck, arm, jaw, or back. °· Unexplained heartburn or indigestion. °· Shortness of breath. °· Nausea. °· Sudden cold sweats. °Women and people with diabetes may have unusual (atypical) symptoms, such as: °· Fatigue. °· Unexplained feelings of nervousness or anxiety. °· Unexplained weakness. °· Dizziness or fainting. °How is this diagnosed? °This condition may be diagnosed based on: °· Your symptoms and medical history. °· Electrocardiogram (ECG) to measure the electrical activity in your heart. °· Blood tests. °· Stress test to look for signs of blockage when your heart is stressed. °· CT angiogram to examine your heart and the blood flow to it. °· Coronary angiogram to check your coronary arteries for blockage. °How is this treated? °Angina may be treated with: °· Medicines to: °? Prevent blood clots and heart attack. °? Relax blood vessels and improve blood flow to the heart (nitrates). °? Reduce blood pressure, improve the pumping action of the heart, and relax blood vessels that are spasming. °? Reduce cholesterol and help treat atherosclerosis. °· A procedure to widen a narrowed or blocked coronary artery (angioplasty). A mesh tube may be placed in a coronary artery to keep it open (coronary stenting). °· Surgery to allow blood to go around a blocked artery (  coronary artery bypass surgery). °Follow these instructions at home: °Medicines °· Take over-the-counter and prescription medicines only as told by your health care provider. °· Do not take the following medicines unless your health care provider approves: °? NSAIDs, such as ibuprofen, naproxen, or celecoxib. °? Vitamin supplements that contain vitamin A, vitamin E, or  both. °? Hormone replacement therapy that contains estrogen with or without progestin. °Eating and drinking ° °· Eat a heart-healthy diet. This includes plenty of fresh fruits and vegetables, whole grains, low-fat (lean) protein, and low-fat dairy products. °· Follow instructions from your health care provider about eating or drinking restrictions. °Activity °· Follow an exercise program approved by your health care provider. Join a cardiac rehabilitation program. °· Take a break when you feel fatigued. Plan rest periods in your daily activities. °Lifestyle ° °· Do not use any products that contain nicotine or tobacco, such as cigarettes and e-cigarettes. If you need help quitting, ask your health care provider. °· If your health care provider approves, limit alcohol intake to no more than 1 drink a day for women and 2 drinks a day for men. One drink equals 12 oz of beer, 5 oz of wine, or 1½ oz of hard liquor. °General instructions °· Maintain a healthy weight. °· Learn to manage stress. °· Keep your vaccinations up to date. Get the flu (influenza) vaccine every year. °· Talk to your health care provider if you feel depressed. Take a depression screening test to see if you are at risk for depression. °· Work with your health care provider to manage other health conditions, such as hypertension or diabetes. °· Keep all follow-up visits as told by your health care provider. This is important. °Get help right away if: °· You have pain in your chest, neck, arm, jaw, or back, and the pain: °? Lasts more than a few minutes. °? Is recurring. °? Is not relieved by taking medicines under the tongue (sublingual nitroglycerin). °? Increases in intensity or frequency. °· You have a lot of sweating without cause. °· You have unexplained: °? Heartburn or indigestion. °? Shortness of breath or difficulty breathing. °? Nausea or vomiting. °? Fatigue. °? Feelings of nervousness or anxiety. °? Weakness. °· You have sudden  light-headedness or dizziness. °· You faint. °These symptoms may represent a serious problem that is an emergency. Do not wait to see if the symptoms will go away. Get medical help right away. Call your local emergency services (911 in the [place] Do not drive yourself to the hospital. °Summary °· Angina is extreme discomfort in the chest, neck, or arm that is caused by a lack of blood in the heart wall. °· There are many symptoms of angina. They include chest pain or pain in the arms, neck, jaw, or back. °· Angina may be treated with behavioral changes, medicine, or surgery. °· Symptoms of angina may represent an emergency. Get medical help right away. Call your local emergency services (911 in the [place] Do not drive yourself to the hospital. °This information is not intended to replace advice given to you by your health care provider. Make sure you discuss any questions you have with your health care provider. °Document Released: 07/19/2005 Document Revised: 09/02/2017 Document Reviewed: 09/02/2017 °Elsevier Interactive Patient Education © 2019 [employer] Inc. ° °

## 2019-01-05 ENCOUNTER — Telehealth: Payer: Self-pay

## 2019-01-05 LAB — PSA: Prostate Specific Ag, Serum: 0.1 ng/mL (ref 0.0–4.0)

## 2019-01-05 LAB — CBC WITH DIFFERENTIAL/PLATELET
Basophils Absolute: 0.1 10*3/uL (ref 0.0–0.2)
Basos: 1 %
EOS (ABSOLUTE): 0.1 10*3/uL (ref 0.0–0.4)
Eos: 2 %
Hematocrit: 45.9 % (ref 37.5–51.0)
Hemoglobin: 16.1 g/dL (ref 13.0–17.7)
Immature Grans (Abs): 0 10*3/uL (ref 0.0–0.1)
Immature Granulocytes: 1 %
Lymphocytes Absolute: 2.4 10*3/uL (ref 0.7–3.1)
Lymphs: 38 %
MCH: 30.5 pg (ref 26.6–33.0)
MCHC: 35.1 g/dL (ref 31.5–35.7)
MCV: 87 fL (ref 79–97)
Monocytes Absolute: 0.6 10*3/uL (ref 0.1–0.9)
Monocytes: 10 %
Neutrophils Absolute: 3.1 10*3/uL (ref 1.4–7.0)
Neutrophils: 48 %
Platelets: 249 10*3/uL (ref 150–450)
RBC: 5.28 x10E6/uL (ref 4.14–5.80)
RDW: 12.3 % (ref 11.6–15.4)
WBC: 6.3 10*3/uL (ref 3.4–10.8)

## 2019-01-05 LAB — COMPREHENSIVE METABOLIC PANEL
ALT: 16 IU/L (ref 0–44)
AST: 14 IU/L (ref 0–40)
Albumin/Globulin Ratio: 2 (ref 1.2–2.2)
Albumin: 4.9 g/dL (ref 3.8–4.9)
Alkaline Phosphatase: 56 IU/L (ref 39–117)
BUN/Creatinine Ratio: 14 (ref 9–20)
BUN: 18 mg/dL (ref 6–24)
Bilirubin Total: 0.5 mg/dL (ref 0.0–1.2)
CO2: 23 mmol/L (ref 20–29)
Calcium: 10 mg/dL (ref 8.7–10.2)
Chloride: 105 mmol/L (ref 96–106)
Creatinine, Ser: 1.27 mg/dL (ref 0.76–1.27)
GFR calc Af Amer: 74 mL/min/{1.73_m2} (ref >59)
GFR calc non Af Amer: 64 mL/min/{1.73_m2} (ref >59)
Globulin, Total: 2.4 g/dL (ref 1.5–4.5)
Glucose: 87 mg/dL (ref 65–99)
Potassium: 4.8 mmol/L (ref 3.5–5.2)
Sodium: 143 mmol/L (ref 134–144)
Total Protein: 7.3 g/dL (ref 6.0–8.5)

## 2019-01-05 LAB — HIV ANTIBODY (ROUTINE TESTING W REFLEX): HIV Screen 4th Generation wRfx: NONREACTIVE

## 2019-01-05 LAB — LIPID PANEL
Chol/HDL Ratio: 4.4 ratio (ref 0.0–5.0)
Cholesterol, Total: 232 mg/dL — ABNORMAL HIGH (ref 100–199)
HDL: 53 mg/dL (ref >39)
LDL Calculated: 166 mg/dL — ABNORMAL HIGH (ref 0–99)
Triglycerides: 64 mg/dL (ref 0–149)
VLDL Cholesterol Cal: 13 mg/dL (ref 5–40)

## 2019-01-05 LAB — TSH: TSH: 1.52 u[IU]/mL (ref 0.450–4.500)

## 2019-01-05 NOTE — Telephone Encounter (Signed)
[email or web address]

## 2019-01-05 NOTE — Telephone Encounter (Signed)
-----   Message from [name], [place] sent at 01/05/2019 11:18 AM EDT ----- Luis Ray looks normal except for cholesterol panel which is slightly high. He doesn't require medication treatment yet but he should focus on increasing exercise, losing weight and avoiding saturated fats.

## 2019-01-05 NOTE — Telephone Encounter (Signed)
Patient advised as below.  

## 2019-01-16 ENCOUNTER — Other Ambulatory Visit: Payer: Self-pay | Admitting: Physician Assistant

## 2019-01-16 DIAGNOSIS — F419 Anxiety disorder, unspecified: Secondary | ICD-10-CM

## 2019-01-16 NOTE — Telephone Encounter (Signed)
[facility] faxed refill request for the following medications:  clonazePAM (KLONOPIN) 0.5 MG tablet   Please advise. Thanks TNP

## 2019-01-17 MED ORDER — CLONAZEPAM 0.5 MG PO TABS: 0.5000 mg | ORAL_TABLET | Freq: Two times a day (BID) | ORAL | 0 refills | Status: DC

## 2019-02-05 DIAGNOSIS — Z20828 Contact with and (suspected) exposure to other viral communicable diseases: Secondary | ICD-10-CM | POA: Diagnosis not present

## 2019-02-19 ENCOUNTER — Encounter: Payer: Self-pay | Admitting: Physician Assistant

## 2019-02-20 ENCOUNTER — Other Ambulatory Visit: Payer: Self-pay | Admitting: Medical

## 2019-02-20 ENCOUNTER — Other Ambulatory Visit: Payer: Self-pay | Admitting: Registered Nurse

## 2019-02-20 NOTE — Telephone Encounter (Signed)
Please review for  your patient. Thank you Daryll Drown PA-C

## 2019-02-27 ENCOUNTER — Other Ambulatory Visit: Payer: Self-pay | Admitting: Physician Assistant

## 2019-02-27 DIAGNOSIS — F419 Anxiety disorder, unspecified: Secondary | ICD-10-CM

## 2019-02-27 MED ORDER — CLONAZEPAM 0.5 MG PO TABS: 0.5000 mg | ORAL_TABLET | Freq: Two times a day (BID) | ORAL | 0 refills | Status: DC

## 2019-02-27 NOTE — Telephone Encounter (Signed)
Pt needs a refill on   Clonazepam 0.5 mg  [facility]  CB#  [phone]  [place]

## 2019-03-05 ENCOUNTER — Encounter: Payer: Self-pay | Admitting: Physician Assistant

## 2019-03-05 ENCOUNTER — Ambulatory Visit (INDEPENDENT_AMBULATORY_CARE_PROVIDER_SITE_OTHER): Payer: BC Managed Care – PPO | Admitting: Physician Assistant

## 2019-03-05 ENCOUNTER — Other Ambulatory Visit: Payer: Self-pay

## 2019-03-05 VITALS — BP 115/76 | HR 64 | Temp 98.1°F | Resp 16 | Ht 70.0 in | Wt 218.0 lb

## 2019-03-05 DIAGNOSIS — Z0000 Encounter for general adult medical examination without abnormal findings: Secondary | ICD-10-CM | POA: Diagnosis not present

## 2019-03-05 DIAGNOSIS — B001 Herpesviral vesicular dermatitis: Secondary | ICD-10-CM

## 2019-03-05 DIAGNOSIS — F419 Anxiety disorder, unspecified: Secondary | ICD-10-CM

## 2019-03-05 MED ORDER — VALACYCLOVIR HCL 1 G PO TABS: 1000.0000 mg | ORAL_TABLET | Freq: Two times a day (BID) | ORAL | 0 refills | Status: DC

## 2019-03-05 MED ORDER — BUSPIRONE HCL 7.5 MG PO TABS: 7.5000 mg | ORAL_TABLET | Freq: Two times a day (BID) | ORAL | 0 refills | Status: DC

## 2019-03-05 NOTE — Progress Notes (Signed)
Patient: Luis Ray, Male    DOB: 01/13/1966, 53 y.o.   MRN: [id number] Visit Date: 03/05/2019  Today's Provider: Trey SailorsAdriana M Pollak, PA-C   Chief Complaint  Patient presents with  . Annual Exam   Subjective:     Annual physical exam Luis Gesserry L Schicker is a 53 y.o. male who presents today for health maintenance and complete physical. He feels well. He reports exercising yes. He reports he is sleeping fairly well.  Wt Readings from Last 3 Encounters:  03/05/19 218 lb (98.9 kg)  01/04/19 213 lb (96.6 kg)  06/26/18 217 lb 6.4 oz (98.6 kg)   Colonoscopy: 09/30/2014 5 mm polyp, pathology not available in Care Everywhere.   Poor Sleep: gets up in the middle of the night to use the bathroom, denies drowsiness, doze off, has tried mouth piece, duct tape, side sleep  Reports he has a  CPAP machine that he paid out of pocket for. Has tried nasal pillows and full face mask. Reports he got it from a friend but has not had a sleep study, says he cannot tolerate the machine. He has not gotten a sleep study.   Finasteride: 1/4 pill daily for hair loss.   Anxiety: 1 in the morning reliably, +/- one in the afterrnoon 2/3 time  Klonopin: He has been taking this for two years. He has never tried anything else for anxiety.  -------------------------------------------------------------   Review of Systems  All other systems reviewed and are negative.   Social History      He  reports that he has never smoked. He has never used smokeless tobacco. He reports current alcohol use. He reports that he does not use drugs.       Social History   Socioeconomic History  . Marital status: Married    Spouse name: Not on file  . Number of children: Not on file  . Years of education: Not on file  . Highest education level: Not on file  Occupational History  . Not on file  Social Needs  . Financial resource strain: Not on file  . Food insecurity    Worry: Not on file    Inability: Not on file  .  Transportation needs    Medical: Not on file    Non-medical: Not on file  Tobacco Use  . Smoking status: Never Smoker  . Smokeless tobacco: Never Used  Substance and Sexual Activity  . Alcohol use: Yes    Alcohol/week: 0.0 standard drinks    Comment: rare  . Drug use: No  . Sexual activity: Not on file  Lifestyle  . Physical activity    Days per week: Not on file    Minutes per session: Not on file  . Stress: Not on file  Relationships  . Social Musicianconnections    Talks on phone: Not on file    Gets together: Not on file    Attends religious service: Not on file    Active member of club or organization: Not on file    Attends meetings of clubs or organizations: Not on file    Relationship status: Not on file  Other Topics Concern  . Not on file  Social History Narrative  . Not on file    Past Medical History:  Diagnosis Date  . Alopecia   . Insomnia      Patient Active Problem List   Diagnosis Date Noted  . GERD (gastroesophageal reflux disease) 08/31/2016  .  Hyperglycemia 09/16/2015  . Chronic anxiety 10/10/2008  . Herpes 08/02/2004  . Allergic rhinitis 08/02/2002  . Hypercholesterolemia without hypertriglyceridemia 02/07/2001  . Alopecia, male pattern 08/02/2000    Past Surgical History:  Procedure Laterality Date  . ROTATOR CUFF REPAIR Right     Family History        Family Status  Relation Name Status  . Mother  Alive  . Father  Alive  . Mat Uncle  Deceased       heart attack  . MGF  Deceased       heart attack        His family history includes Coronary artery disease in his father.      Allergies  Allergen Reactions  . Sulfa Antibiotics Other (See Comments)    Upset stomach     Current Outpatient Medications:  .  cetirizine (ZYRTEC) 10 MG tablet, Take 10 mg by mouth daily., Disp: , Rfl:  .  clonazePAM (KLONOPIN) 0.5 MG tablet, Take 1 tablet (0.5 mg total) by mouth 2 (two) times daily., Disp: 60 tablet, Rfl: 0 .  finasteride (PROSCAR) 5  MG tablet, TAKE 1/4 TABLET BY MOUTH DAILY AS DIRECTED BY PHYSICIAN., Disp: 30 tablet, Rfl: 3 .  fluticasone (FLONASE) 50 MCG/ACT nasal spray, SPRAY 2 SPRAYS INTO EACH NOSTRIL EVERY DAY, Disp: 48 mL, Rfl: 0 .  valACYclovir HCl (VALTREX PO), Take by mouth., Disp: , Rfl:  .  pseudoephedrine (SUDAFED) 60 MG tablet, Take 60 mg by mouth every 4 (four) hours as needed for congestion., Disp: , Rfl:    Patient Care Team: Maryella ShiversPollak, Adriana M, PA-C as PCP - General (Physician Assistant)    Objective:    Vitals: BP 115/76 (BP Location: Left Arm, Patient Position: Sitting, Cuff Size: Large)   Pulse 64   Temp 98.1 F (36.7 C) (Oral)   Resp 16   Ht 5\' 10"  (1.778 m)   Wt 218 lb (98.9 kg)   SpO2 96%   BMI 31.28 kg/m    Vitals:   03/05/19 1342  BP: 115/76  Pulse: 64  Resp: 16  Temp: 98.1 F (36.7 C)  TempSrc: Oral  SpO2: 96%  Weight: 218 lb (98.9 kg)  Height: 5\' 10"  (1.778 m)     Physical Exam Constitutional:      Appearance: Normal appearance.  Cardiovascular:     Rate and Rhythm: Normal rate and regular rhythm.     Heart sounds: Normal heart sounds.  Pulmonary:     Effort: Pulmonary effort is normal.     Breath sounds: Normal breath sounds.  Abdominal:     General: Abdomen is flat. Bowel sounds are normal.     Palpations: Abdomen is soft.  Skin:    General: Skin is warm and dry.  Neurological:     Mental Status: He is alert and oriented to person, place, and time. Mental status is at baseline.  Psychiatric:        Mood and Affect: Mood normal.        Behavior: Behavior normal.      Depression Screen PHQ 2/9 Scores 03/05/2019  PHQ - 2 Score 0  PHQ- 9 Score 0       Assessment & Plan:     Routine Health Maintenance and Physical Exam  Exercise Activities and Dietary recommendations Goals   None     Immunization History  Administered Date(s) Administered  . Influenza,inj,Quad PF,6+ Mos 08/19/2014, 08/31/2016, 09/16/2017  . Tdap 09/05/2009    Health  Maintenance  Topic Date Due  . INFLUENZA VACCINE  03/03/2019  . TETANUS/TDAP  09/06/2019  . COLONOSCOPY  09/29/2024  . HIV Screening  Completed     Discussed health benefits of physical activity, and encouraged him to engage in regular exercise appropriate for his age and condition.    1. Annual physical exam  Requesting colonoscopy pathology from [facility] clinic.  2. Chronic anxiety  Counseled on long term risks of using benzodiazepines and how this is not ideal. He has not tried anything else for anxiety. We will start Buspar as below. I have advised him on how to taper klonopin. He will start by decreasing his nightly dose to 0.25 mg tablets or half a tab.   - busPIRone (BUSPAR) 7.5 MG tablet; Take 1 tablet (7.5 mg total) by mouth 2 (two) times daily.  Dispense: 180 tablet; Refill: 0  3. Cold sore  - valACYclovir (VALTREX) 1000 MG tablet; Take 1 tablet (1,000 mg total) by mouth 2 (two) times daily for 7 days.  Dispense: 30 tablet; Refill: 0  The entirety of the information documented in the History of Present Illness, Review of Systems and Physical Exam were personally obtained by me. Portions of this information were initially documented by April M. Sabra Heck, CMA and reviewed by me for thoroughness and accuracy.   --------------------------------------------------------------------    Trinna Post, PA-C  [facility]

## 2019-03-05 NOTE — Patient Instructions (Signed)
Klonopin: For one month, continue taking 0.5 mg or one tablet in the morning and if you need to take a tablet at night, take 0.25 mg or 1/2 tablet at night for one month. Then, the next month just take 1 tablet in the morning of the klonopin. On month three, take 0.25 mg klonopin in the morning. On month four, take 0.25 mg klonopin every other day.   We are also starting buspar, which is 7.5 mg twice daily every day. You can take ths with klonopin.   Generalized Anxiety Disorder, Adult Generalized anxiety disorder (GAD) is a mental health disorder. People with this condition constantly worry about everyday events. Unlike normal anxiety, worry related to GAD is not triggered by a specific event. These worries also do not fade or get better with time. GAD interferes with life functions, including relationships, work, and school. GAD can vary from mild to severe. People with severe GAD can have intense waves of anxiety with physical symptoms (panic attacks). What are the causes? The exact cause of GAD is not known. What increases the risk? This condition is more likely to develop in:  Women.  People who have a family history of anxiety disorders.  People who are very shy.  People who experience very stressful life events, such as the death of a loved one.  People who have a very stressful family environment. What are the signs or symptoms? People with GAD often worry excessively about many things in their lives, such as their health and family. They may also be overly concerned about:  Doing well at work.  Being on time.  Natural disasters.  Friendships. Physical symptoms of GAD include:  Fatigue.  Muscle tension or having muscle twitches.  Trembling or feeling shaky.  Being easily startled.  Feeling like your heart is pounding or racing.  Feeling out of breath or like you cannot take a deep breath.  Having trouble falling asleep or staying asleep.  Sweating.  Nausea,  diarrhea, or irritable bowel syndrome (IBS).  Headaches.  Trouble concentrating or remembering facts.  Restlessness.  Irritability. How is this diagnosed? Your health care provider can diagnose GAD based on your symptoms and medical history. You will also have a physical exam. The health care provider will ask specific questions about your symptoms, including how severe they are, when they started, and if they come and go. Your health care provider may ask you about your use of alcohol or drugs, including prescription medicines. Your health care provider may refer you to a mental health specialist for further evaluation. Your health care provider will do a thorough examination and may perform additional tests to rule out other possible causes of your symptoms. To be diagnosed with GAD, a person must have anxiety that:  Is out of his or her control.  Affects several different aspects of his or her life, such as work and relationships.  Causes distress that makes him or her unable to take part in normal activities.  Includes at least three physical symptoms of GAD, such as restlessness, fatigue, trouble concentrating, irritability, muscle tension, or sleep problems. Before your health care provider can confirm a diagnosis of GAD, these symptoms must be present more days than they are not, and they must last for six months or longer. How is this treated? The following therapies are usually used to treat GAD:  Medicine. Antidepressant medicine is usually prescribed for long-term daily control. Antianxiety medicines may be added in severe cases, especially when panic  attacks occur.  Talk therapy (psychotherapy). Certain types of talk therapy can be helpful in treating GAD by providing support, education, and guidance. Options include: ? Cognitive behavioral therapy (CBT). People learn coping skills and techniques to ease their anxiety. They learn to identify unrealistic or negative thoughts  and behaviors and to replace them with positive ones. ? Acceptance and commitment therapy (ACT). This treatment teaches people how to be mindful as a way to cope with unwanted thoughts and feelings. ? Biofeedback. This process trains you to manage your body's response (physiological response) through breathing techniques and relaxation methods. You will work with a therapist while machines are used to monitor your physical symptoms.  Stress management techniques. These include yoga, meditation, and exercise. A mental health specialist can help determine which treatment is best for you. Some people see improvement with one type of therapy. However, other people require a combination of therapies. Follow these instructions at home:  Take over-the-counter and prescription medicines only as told by your health care provider.  Try to maintain a normal routine.  Try to anticipate stressful situations and allow extra time to manage them.  Practice any stress management or self-calming techniques as taught by your health care provider.  Do not punish yourself for setbacks or for not making progress.  Try to recognize your accomplishments, even if they are small.  Keep all follow-up visits as told by your health care provider. This is important. Contact a health care provider if:  Your symptoms do not get better.  Your symptoms get worse.  You have signs of depression, such as: ? A persistently sad, cranky, or irritable mood. ? Loss of enjoyment in activities that used to bring you joy. ? Change in weight or eating. ? Changes in sleeping habits. ? Avoiding friends or family members. ? Loss of energy for normal tasks. ? Feelings of guilt or worthlessness. Get help right away if:  You have serious thoughts about hurting yourself or others. If you ever feel like you may hurt yourself or others, or have thoughts about taking your own life, get help right away. You can go to your nearest  emergency department or call:  Your local emergency services (911 in the [place]  A suicide crisis helpline, such as the National Suicide Prevention Lifeline at 209-257-69851-267-135-7754. This is open 24 hours a day. Summary  Generalized anxiety disorder (GAD) is a mental health disorder that involves worry that is not triggered by a specific event.  People with GAD often worry excessively about many things in their lives, such as their health and family.  GAD may cause physical symptoms such as restlessness, trouble concentrating, sleep problems, frequent sweating, nausea, diarrhea, headaches, and trembling or muscle twitching.  A mental health specialist can help determine which treatment is best for you. Some people see improvement with one type of therapy. However, other people require a combination of therapies. This information is not intended to replace advice given to you by your health care provider. Make sure you discuss any questions you have with your health care provider. Document Released: 11/13/2012 Document Revised: 07/01/2017 Document Reviewed: 06/08/2016 Elsevier Patient Education  2020 [employer]

## 2019-03-12 ENCOUNTER — Other Ambulatory Visit: Payer: Self-pay | Admitting: Physician Assistant

## 2019-03-12 DIAGNOSIS — B001 Herpesviral vesicular dermatitis: Secondary | ICD-10-CM

## 2019-03-27 ENCOUNTER — Other Ambulatory Visit: Payer: Self-pay | Admitting: Physician Assistant

## 2019-03-27 DIAGNOSIS — B001 Herpesviral vesicular dermatitis: Secondary | ICD-10-CM

## 2019-04-03 ENCOUNTER — Other Ambulatory Visit: Payer: Self-pay | Admitting: Physician Assistant

## 2019-04-03 DIAGNOSIS — B001 Herpesviral vesicular dermatitis: Secondary | ICD-10-CM

## 2019-04-03 NOTE — Telephone Encounter (Signed)
Please review

## 2019-04-13 ENCOUNTER — Other Ambulatory Visit: Payer: Self-pay | Admitting: Family Medicine

## 2019-04-17 ENCOUNTER — Ambulatory Visit (INDEPENDENT_AMBULATORY_CARE_PROVIDER_SITE_OTHER): Payer: BC Managed Care – PPO | Admitting: Physician Assistant

## 2019-04-17 ENCOUNTER — Encounter: Payer: Self-pay | Admitting: Physician Assistant

## 2019-04-17 ENCOUNTER — Other Ambulatory Visit: Payer: Self-pay

## 2019-04-17 VITALS — BP 122/90 | HR 65 | Temp 96.9°F | Resp 16 | Wt 216.8 lb

## 2019-04-17 DIAGNOSIS — G4700 Insomnia, unspecified: Secondary | ICD-10-CM | POA: Diagnosis not present

## 2019-04-17 DIAGNOSIS — F419 Anxiety disorder, unspecified: Secondary | ICD-10-CM

## 2019-04-17 MED ORDER — TRAZODONE HCL 50 MG PO TABS: 25.0000 mg | ORAL_TABLET | Freq: Every evening | ORAL | 3 refills | Status: DC | PRN

## 2019-04-17 NOTE — Progress Notes (Signed)
Patient: Luis Ray Male    DOB: June 02, 1966   53 y.o.   MRN: [id number] Visit Date: 04/17/2019  Today's Provider: Trinna Post, PA-C   Chief Complaint  Patient presents with  . Anxiety   Subjective:     Anxiety Presents for follow-up visit. Symptoms include excessive worry, insomnia, nervous/anxious behavior and restlessness. Symptoms occur constantly. The quality of sleep is fair. Nighttime awakenings: several.   Compliance with medications is 76-100%.   Currently not taking klonopin at all because he forgot how to take it as a taper. Having sleep issues and anxiety issues. He is taking buspar 7.5 mg BID. He is not sure this is working because he is having increased anxiety and sleep issues after coming off klonopin. He has not taken klonopin in 10 days.   BP Readings from Last 3 Encounters:  04/17/19 122/90  03/05/19 115/76  01/04/19 123/81       Allergies  Allergen Reactions  . Sulfa Antibiotics Other (See Comments)    Upset stomach     Current Outpatient Medications:  .  busPIRone (BUSPAR) 7.5 MG tablet, Take 1 tablet (7.5 mg total) by mouth 2 (two) times daily., Disp: 180 tablet, Rfl: 0 .  cetirizine (ZYRTEC) 10 MG tablet, Take 10 mg by mouth daily., Disp: , Rfl:  .  finasteride (PROSCAR) 5 MG tablet, TAKE 1/4 TABLET BY MOUTH DAILY AS DIRECTED BY PHYSICIAN., Disp: 30 tablet, Rfl: 3 .  fluticasone (FLONASE) 50 MCG/ACT nasal spray, SPRAY 2 SPRAYS INTO EACH NOSTRIL EVERY DAY, Disp: 48 mL, Rfl: 0 .  pseudoephedrine (SUDAFED) 60 MG tablet, Take 60 mg by mouth every 4 (four) hours as needed for congestion., Disp: , Rfl:  .  clonazePAM (KLONOPIN) 0.5 MG tablet, Take 1 tablet (0.5 mg total) by mouth 2 (two) times daily. (Patient not taking: Reported on 04/17/2019), Disp: 60 tablet, Rfl: 0  Review of Systems  Psychiatric/Behavioral: The patient is nervous/anxious and has insomnia.     Social History   Tobacco Use  . Smoking status: Never Smoker  .  Smokeless tobacco: Never Used  Substance Use Topics  . Alcohol use: Yes    Alcohol/week: 0.0 standard drinks    Comment: rare      Objective:   BP 122/90 (BP Location: Right Arm, Patient Position: Sitting, Cuff Size: Normal)   Pulse 65   Temp (!) 96.9 F (36.1 C) (Temporal)   Resp 16   Wt 216 lb 12.8 oz (98.3 kg)   SpO2 97%   BMI 31.11 kg/m  Vitals:   04/17/19 0812  BP: 122/90  Pulse: 65  Resp: 16  Temp: (!) 96.9 F (36.1 C)  TempSrc: Temporal  SpO2: 97%  Weight: 216 lb 12.8 oz (98.3 kg)  Body mass index is 31.11 kg/m.   Physical Exam Constitutional:      Appearance: Normal appearance.  Cardiovascular:     Heart sounds: Normal heart sounds.  Pulmonary:     Effort: Pulmonary effort is normal.  Skin:    General: Skin is warm and dry.  Neurological:     Mental Status: He is alert and oriented to person, place, and time. Mental status is at baseline.  Psychiatric:        Mood and Affect: Mood normal.        Behavior: Behavior normal.      No results found for any visits on 04/17/19.     Assessment & Plan    1.  Insomnia, unspecified type  Two options: may resume klonopin and proceed with taper or may try trazadone. Patient would like to try trazadone. Counseled on risks. We will leave buspar as it is right now and follow up in one month.   - traZODone (DESYREL) 50 MG tablet; Take 0.5-1 tablets (25-50 mg total) by mouth at bedtime as needed for sleep.  Dispense: 30 tablet; Refill: 3  2. Chronic anxiety  The entirety of the information documented in the History of Present Illness, Review of Systems and Physical Exam were personally obtained by me. Portions of this information were initially documented by Rondel BatonSulibeya Dimas, CMA and reviewed by me for thoroughness and accuracy.        Trey SailorsAdriana M Hensley Aziz, PA-C  [facility]

## 2019-04-17 NOTE — Patient Instructions (Addendum)
Start trazadone 25-50 mg nightly. If not effective, after three days may increase to 100 mg nightly.    Generalized Anxiety Disorder, Adult Generalized anxiety disorder (GAD) is a mental health disorder. People with this condition constantly worry about everyday events. Unlike normal anxiety, worry related to GAD is not triggered by a specific event. These worries also do not fade or get better with time. GAD interferes with life functions, including relationships, work, and school. GAD can vary from mild to severe. People with severe GAD can have intense waves of anxiety with physical symptoms (panic attacks). What are the causes? The exact cause of GAD is not known. What increases the risk? This condition is more likely to develop in:  Women.  People who have a family history of anxiety disorders.  People who are very shy.  People who experience very stressful life events, such as the death of a loved one.  People who have a very stressful family environment. What are the signs or symptoms? People with GAD often worry excessively about many things in their lives, such as their health and family. They may also be overly concerned about:  Doing well at work.  Being on time.  Natural disasters.  Friendships. Physical symptoms of GAD include:  Fatigue.  Muscle tension or having muscle twitches.  Trembling or feeling shaky.  Being easily startled.  Feeling like your heart is pounding or racing.  Feeling out of breath or like you cannot take a deep breath.  Having trouble falling asleep or staying asleep.  Sweating.  Nausea, diarrhea, or irritable bowel syndrome (IBS).  Headaches.  Trouble concentrating or remembering facts.  Restlessness.  Irritability. How is this diagnosed? Your health care provider can diagnose GAD based on your symptoms and medical history. You will also have a physical exam. The health care provider will ask specific questions about your  symptoms, including how severe they are, when they started, and if they come and go. Your health care provider may ask you about your use of alcohol or drugs, including prescription medicines. Your health care provider may refer you to a mental health specialist for further evaluation. Your health care provider will do a thorough examination and may perform additional tests to rule out other possible causes of your symptoms. To be diagnosed with GAD, a person must have anxiety that:  Is out of his or her control.  Affects several different aspects of his or her life, such as work and relationships.  Causes distress that makes him or her unable to take part in normal activities.  Includes at least three physical symptoms of GAD, such as restlessness, fatigue, trouble concentrating, irritability, muscle tension, or sleep problems. Before your health care provider can confirm a diagnosis of GAD, these symptoms must be present more days than they are not, and they must last for six months or longer. How is this treated? The following therapies are usually used to treat GAD:  Medicine. Antidepressant medicine is usually prescribed for long-term daily control. Antianxiety medicines may be added in severe cases, especially when panic attacks occur.  Talk therapy (psychotherapy). Certain types of talk therapy can be helpful in treating GAD by providing support, education, and guidance. Options include: ? Cognitive behavioral therapy (CBT). People learn coping skills and techniques to ease their anxiety. They learn to identify unrealistic or negative thoughts and behaviors and to replace them with positive ones. ? Acceptance and commitment therapy (ACT). This treatment teaches people how to be mindful as  a way to cope with unwanted thoughts and feelings. ? Biofeedback. This process trains you to manage your body's response (physiological response) through breathing techniques and relaxation methods. You  will work with a therapist while machines are used to monitor your physical symptoms.  Stress management techniques. These include yoga, meditation, and exercise. A mental health specialist can help determine which treatment is best for you. Some people see improvement with one type of therapy. However, other people require a combination of therapies. Follow these instructions at home:  Take over-the-counter and prescription medicines only as told by your health care provider.  Try to maintain a normal routine.  Try to anticipate stressful situations and allow extra time to manage them.  Practice any stress management or self-calming techniques as taught by your health care provider.  Do not punish yourself for setbacks or for not making progress.  Try to recognize your accomplishments, even if they are small.  Keep all follow-up visits as told by your health care provider. This is important. Contact a health care provider if:  Your symptoms do not get better.  Your symptoms get worse.  You have signs of depression, such as: ? A persistently sad, cranky, or irritable mood. ? Loss of enjoyment in activities that used to bring you joy. ? Change in weight or eating. ? Changes in sleeping habits. ? Avoiding friends or family members. ? Loss of energy for normal tasks. ? Feelings of guilt or worthlessness. Get help right away if:  You have serious thoughts about hurting yourself or others. If you ever feel like you may hurt yourself or others, or have thoughts about taking your own life, get help right away. You can go to your nearest emergency department or call:  Your local emergency services (911 in the [place]  A suicide crisis helpline, such as the National Suicide Prevention Lifeline at 949-872-00651-(907)421-8210. This is open 24 hours a day. Summary  Generalized anxiety disorder (GAD) is a mental health disorder that involves worry that is not triggered by a specific event.  People  with GAD often worry excessively about many things in their lives, such as their health and family.  GAD may cause physical symptoms such as restlessness, trouble concentrating, sleep problems, frequent sweating, nausea, diarrhea, headaches, and trembling or muscle twitching.  A mental health specialist can help determine which treatment is best for you. Some people see improvement with one type of therapy. However, other people require a combination of therapies. This information is not intended to replace advice given to you by your health care provider. Make sure you discuss any questions you have with your health care provider. Document Released: 11/13/2012 Document Revised: 07/01/2017 Document Reviewed: 06/08/2016 Elsevier Patient Education  2020 [employer]

## 2019-05-09 ENCOUNTER — Other Ambulatory Visit: Payer: Self-pay | Admitting: Physician Assistant

## 2019-05-09 DIAGNOSIS — G4700 Insomnia, unspecified: Secondary | ICD-10-CM

## 2019-05-17 ENCOUNTER — Ambulatory Visit: Payer: BC Managed Care – PPO | Admitting: Physician Assistant

## 2019-05-17 ENCOUNTER — Other Ambulatory Visit: Payer: Self-pay

## 2019-05-17 VITALS — BP 120/77 | HR 66 | Temp 97.3°F | Wt 218.0 lb

## 2019-05-17 DIAGNOSIS — M79642 Pain in left hand: Secondary | ICD-10-CM | POA: Diagnosis not present

## 2019-05-17 DIAGNOSIS — F419 Anxiety disorder, unspecified: Secondary | ICD-10-CM | POA: Diagnosis not present

## 2019-05-17 NOTE — Progress Notes (Signed)
Patient: Luis Ray Male    DOB: 25-Jul-1966   53 y.o.   MRN: [id number] Visit Date: 05/17/2019  Today's Provider: Trinna Post, PA-C   Chief Complaint  Patient presents with  . Anxiety   Subjective:      From 04/17/2019  1. Insomnia, unspecified type  Two options: may resume klonopin and proceed with taper or may try trazadone. Patient would like to try trazadone. Counseled on risks. We will leave buspar as it is right now and follow up in one month.   - traZODone (DESYREL) 50 MG tablet; Take 0.5-1 tablets (25-50 mg total) by mouth at bedtime as needed for sleep.  Dispense: 30 tablet; Refill: 3  05/17/2019   He is doing well today with addition of trazadone. He is taking 1/2 to 1 full pill nightly most nights. He continues to take Buspar 7.5 mg BID. He reports his anxiety is doing better.   Left Hand Pain   Reports [place] and [place] post and he heard a crunch the other week. He reports his left ring finger is swollen. He reports he has less strength with this finger. He is unable to remove his ring off.   Anxiety Presents for follow-up visit. Symptoms include insomnia. Patient reports no chest pain, confusion, decreased concentration, depressed mood, dizziness, excessive worry, feeling of choking, nervous/anxious behavior, panic, shortness of breath or suicidal ideas.    Insomnia Primary symptoms: no sleep disturbance.  The problem has been gradually improving (With using trazodone ) since onset. Past treatments include meditation. The treatment provided significant relief. Typical bedtime:  10-11 P.M..  How long after going to bed to you fall asleep: 15-30 minutes.         Allergies  Allergen Reactions  . Sulfa Antibiotics Other (See Comments)    Upset stomach     Current Outpatient Medications:  .  busPIRone (BUSPAR) 7.5 MG tablet, Take 1 tablet (7.5 mg total) by mouth 2 (two) times daily., Disp: 180 tablet, Rfl: 0 .   cetirizine (ZYRTEC) 10 MG tablet, Take 10 mg by mouth daily., Disp: , Rfl:  .  finasteride (PROSCAR) 5 MG tablet, TAKE 1/4 TABLET BY MOUTH DAILY AS DIRECTED BY PHYSICIAN., Disp: 30 tablet, Rfl: 3 .  fluticasone (FLONASE) 50 MCG/ACT nasal spray, SPRAY 2 SPRAYS INTO EACH NOSTRIL EVERY DAY, Disp: 48 mL, Rfl: 0 .  pseudoephedrine (SUDAFED) 60 MG tablet, Take 60 mg by mouth every 4 (four) hours as needed for congestion., Disp: , Rfl:  .  traZODone (DESYREL) 50 MG tablet, TAKE 1/2 TO 1 TABLET (25-50 MG TOTAL) BY MOUTH AT BEDTIME AS NEEDED FOR SLEEP., Disp: 90 tablet, Rfl: 2  Review of Systems  Constitutional: Negative.   Respiratory: Negative.  Negative for shortness of breath.   Cardiovascular: Negative.  Negative for chest pain.  Gastrointestinal: Negative.   Neurological: Negative for dizziness, light-headedness and headaches.  Psychiatric/Behavioral: Negative for agitation, behavioral problems, confusion, decreased concentration, dysphoric mood, hallucinations, self-injury, sleep disturbance and suicidal ideas. The patient has insomnia. The patient is not nervous/anxious and is not hyperactive.     Social History   Tobacco Use  . Smoking status: Never Smoker  . Smokeless tobacco: Never Used  Substance Use Topics  . Alcohol use: Yes    Alcohol/week: 0.0 standard drinks    Comment: rare      Objective:   BP 120/77 (BP Location: Right Arm, Patient Position: Sitting, Cuff Size:  Large)   Pulse 66   Temp (!) 97.3 F (36.3 C) (Temporal)   Wt 218 lb (98.9 kg)   BMI 31.28 kg/m  Vitals:   05/17/19 0823  BP: 120/77  Pulse: 66  Temp: (!) 97.3 F (36.3 C)  TempSrc: Temporal  Weight: 218 lb (98.9 kg)  Body mass index is 31.28 kg/m.   Physical Exam Constitutional:      Appearance: Normal appearance.  Cardiovascular:     Rate and Rhythm: Regular rhythm.     Pulses: Normal pulses.     Heart sounds: Normal heart sounds.  Pulmonary:     Effort: Pulmonary effort is normal.      Breath sounds: Normal breath sounds.  Musculoskeletal:     Left hand: He exhibits normal range of motion and no tenderness.     Comments: Some swelling at proximal portion of left ring finger. Some reduced strength in this finger.   Skin:    General: Skin is warm and dry.  Neurological:     Mental Status: He is alert and oriented to person, place, and time. Mental status is at baseline.  Psychiatric:        Mood and Affect: Mood normal.        Behavior: Behavior normal.      No results found for any visits on 05/17/19.     Assessment & Plan    1. Chronic anxiety  Continue buspar 7.5 mg BID and trazadone PRN nightly.   2. Left hand pain  Strain or possible tear of tendon. Patient does not want to go to orthopedics.   The entirety of the information documented in the History of Present Illness, Review of Systems and Physical Exam were personally obtained by me. Portions of this information were initially documented by Kavin Leech, CMA and reviewed by me for thoroughness and accuracy.   F/u at next CPE      [name], PA-C  [facility]

## 2019-05-17 NOTE — Patient Instructions (Signed)

## 2019-05-21 DIAGNOSIS — S66114A Strain of flexor muscle, fascia and tendon of right ring finger at wrist and hand level, initial encounter: Secondary | ICD-10-CM | POA: Diagnosis not present

## 2019-05-27 ENCOUNTER — Other Ambulatory Visit: Payer: Self-pay | Admitting: Physician Assistant

## 2019-05-27 DIAGNOSIS — F419 Anxiety disorder, unspecified: Secondary | ICD-10-CM

## 2019-05-28 NOTE — Telephone Encounter (Signed)
L.O.V. was on 05/17/2019, please advise. 

## 2019-06-03 DIAGNOSIS — Z8619 Personal history of other infectious and parasitic diseases: Secondary | ICD-10-CM

## 2019-06-03 DIAGNOSIS — Z8616 Personal history of COVID-19: Secondary | ICD-10-CM

## 2019-06-03 HISTORY — DX: Personal history of other infectious and parasitic diseases: Z86.19

## 2019-06-03 HISTORY — DX: Personal history of COVID-19: Z86.16

## 2019-06-04 ENCOUNTER — Telehealth: Payer: Self-pay | Admitting: Physician Assistant

## 2019-06-04 NOTE — Telephone Encounter (Signed)
Quarantine and test on Wednesday.

## 2019-06-04 NOTE — Telephone Encounter (Signed)
Patient was advised and states that he will come get tested on Wednesday along with quarantine.

## 2019-06-04 NOTE — Telephone Encounter (Signed)
Spoke to patient and he states that his job advised him to contact his provider to see what needs to be done. Patient denies any symptoms at this time and states that he probably was not wearing a mask. He also states that he was talking with the guy who tested positive for COVID-19. Please advise.

## 2019-06-04 NOTE — Telephone Encounter (Signed)
Pt needing a call back to advise him.  He was at a a [employer] on Sat. A person tested positive.  Please call pt back at [phone].  Thanks, [employer]

## 2019-06-06 DIAGNOSIS — Z20828 Contact with and (suspected) exposure to other viral communicable diseases: Secondary | ICD-10-CM | POA: Diagnosis not present

## 2019-06-08 DIAGNOSIS — Z20828 Contact with and (suspected) exposure to other viral communicable diseases: Secondary | ICD-10-CM | POA: Diagnosis not present

## 2019-06-08 DIAGNOSIS — U071 COVID-19: Secondary | ICD-10-CM | POA: Diagnosis not present

## 2019-06-13 ENCOUNTER — Telehealth: Payer: Self-pay | Admitting: Physician Assistant

## 2019-06-13 ENCOUNTER — Other Ambulatory Visit: Payer: Self-pay

## 2019-06-13 ENCOUNTER — Encounter: Payer: BC Managed Care – PPO | Admitting: Family Medicine

## 2019-06-13 ENCOUNTER — Ambulatory Visit
Admission: EM | Admit: 2019-06-13 | Discharge: 2019-06-13 | Disposition: A | Discharge status: Home / Self Care | Payer: BC Managed Care – PPO | Attending: Family Medicine | Admitting: Family Medicine

## 2019-06-13 DIAGNOSIS — U071 COVID-19: Secondary | ICD-10-CM

## 2019-06-13 DIAGNOSIS — R5383 Other fatigue: Secondary | ICD-10-CM

## 2019-06-13 DIAGNOSIS — R05 Cough: Secondary | ICD-10-CM

## 2019-06-13 DIAGNOSIS — R059 Cough, unspecified: Secondary | ICD-10-CM

## 2019-06-13 MED ORDER — HYDROCOD POLST-CPM POLST ER 10-8 MG/5ML PO SUER: 5.0000 mL | Freq: Two times a day (BID) | ORAL | 0 refills | Status: DC | PRN

## 2019-06-13 NOTE — Progress Notes (Signed)
This encounter was created in error - please disregard.

## 2019-06-13 NOTE — ED Triage Notes (Signed)
Pt. States 11/6 he tested POSITIVE for COVID , since he has had mild cough, chest congestion, body aches, fever, low grade fever.

## 2019-06-13 NOTE — ED Provider Notes (Signed)
[facility]    CSN: [id number] Arrival date & time: 06/13/19  1105  History   Chief Complaint Cough  HPI   53 year old male presents with cough.  Patient recently diagnosed with COVID-19.  Tested positive on 11/6.  Patient continues to have cough and associated congestion.  Has had some fatigue as well.  Patient is concerned that he is not seeming to improve quickly.  He is troubled by the cough.  He is requesting cough medication.  He states that he is using over-the-counter cough medication without resolution.  He is currently afebrile.  No shortness of breath.  No other reported symptoms.  No other complaints or concerns at this time.  PMH, Surgical Hx, Family Hx, Social History reviewed and updated as below.  PMH: Patient Active Problem List   Diagnosis Date Noted  . GERD (gastroesophageal reflux disease) 08/31/2016  . Hyperglycemia 09/16/2015  . Chronic anxiety 10/10/2008  . Herpes 08/02/2004  . Allergic rhinitis 08/02/2002  . Hypercholesterolemia without hypertriglyceridemia 02/07/2001  . Alopecia, male pattern 08/02/2000    Past Surgical History:  Procedure Laterality Date  . ROTATOR CUFF REPAIR Right     Home Medications    Prior to Admission medications   Medication Sig Start Date End Date Taking? Authorizing Provider  busPIRone (BUSPAR) 7.5 MG tablet TAKE 1 TABLET (7.5 MG TOTAL) BY MOUTH 2 (TWO) TIMES DAILY. 05/28/19 08/26/19  Trinna Post, PA-C  cetirizine (ZYRTEC) 10 MG tablet Take 10 mg by mouth daily.    [provider]  chlorpheniramine-HYDROcodone (TUSSIONEX PENNKINETIC ER) 10-8 MG/5ML SUER Take 5 mLs by mouth every 12 (twelve) hours as needed. 06/13/19   Burnell Matlin, Barnie Del, DO  finasteride (PROSCAR) 5 MG tablet TAKE 1/4 TABLET BY MOUTH DAILY AS DIRECTED BY PHYSICIAN. 04/13/19   Birdie Sons, MD  fluticasone [facility]) 50 MCG/ACT nasal spray SPRAY 2 SPRAYS INTO EACH NOSTRIL EVERY DAY 02/20/19   Trinna Post, PA-C  pseudoephedrine  (SUDAFED) 60 MG tablet Take 60 mg by mouth every 4 (four) hours as needed for congestion.    [provider]  traZODone (DESYREL) 50 MG tablet TAKE 1/2 TO 1 TABLET (25-50 MG TOTAL) BY MOUTH AT BEDTIME AS NEEDED FOR SLEEP. 05/09/19   Trinna Post, PA-C    Family History Family History  Problem Relation Age of Onset  . Coronary artery disease Father     Social History Social History   Tobacco Use  . Smoking status: Never Smoker  . Smokeless tobacco: Never Used  Substance Use Topics  . Alcohol use: Yes    Alcohol/week: 0.0 standard drinks    Comment: rare  . Drug use: No     Allergies   Sulfa antibiotics   Review of Systems Review of Systems  Constitutional: Positive for fatigue.  Respiratory: Positive for cough.    Physical Exam Triage Vital Signs ED Triage Vitals  Enc Vitals Group     BP 06/13/19 1131 124/80     Pulse Rate 06/13/19 1131 94     Resp --      Temp 06/13/19 1131 99.7 F (37.6 C)     Temp Source 06/13/19 1131 Oral     SpO2 06/13/19 1131 97 %     Weight 06/13/19 1132 215 lb (97.5 kg)     Height --      Head Circumference --      Peak Flow --      Pain Score 06/13/19 1131 0  Pain Loc --      Pain Edu? --      Excl. in GC? --    Updated Vital Signs BP 124/80 (BP Location: Left Arm)   Pulse 94   Temp 99.7 F (37.6 C) (Oral)   Wt 97.5 kg   SpO2 97%   BMI 30.85 kg/m   Visual Acuity Right Eye Distance:   Left Eye Distance:   Bilateral Distance:    Right Eye Near:   Left Eye Near:    Bilateral Near:     Physical Exam Vitals signs and nursing note reviewed.  Constitutional:      General: He is not in acute distress.    Appearance: Normal appearance. He is not ill-appearing.  HENT:     Head: Normocephalic and atraumatic.  Eyes:     General:        Right eye: No discharge.        Left eye: No discharge.     Conjunctiva/sclera: Conjunctivae normal.  Cardiovascular:     Rate and Rhythm: Normal rate and regular rhythm.      Heart sounds: No murmur.  Pulmonary:     Effort: Pulmonary effort is normal.     Breath sounds: Normal breath sounds. No wheezing, rhonchi or rales.  Neurological:     Mental Status: He is alert.  Psychiatric:        Mood and Affect: Mood normal.        Behavior: Behavior normal.    UC Treatments / Results  Labs (all labs ordered are listed, but only abnormal results are displayed) Labs Reviewed - No data to display  EKG   Radiology No results found.  Procedures Procedures (including critical care time)  Medications Ordered in UC Medications - No data to display  Initial Impression / Assessment and Plan / UC Course  I have reviewed the triage vital signs and the nursing notes.  Pertinent labs & imaging results that were available during my care of the patient were reviewed by me and considered in my medical decision making (see chart for details).    53 year old male presents with ongoing cough in the setting of COVID-19.  Tussionex as prescribed.  Supportive care.  Final Clinical Impressions(s) / UC Diagnoses   Final diagnoses:  COVID-19  Cough     Discharge Instructions     Medication as needed.  Take care  Dr. Adriana Simas     ED Prescriptions    Medication Sig Dispense Auth. Provider   chlorpheniramine-HYDROcodone (TUSSIONEX PENNKINETIC ER) 10-8 MG/5ML SUER Take 5 mLs by mouth every 12 (twelve) hours as needed. 60 mL Tommie Sams, DO     PDMP not reviewed this encounter.   Tommie Sams, [place] 06/13/19 1217

## 2019-06-13 NOTE — Discharge Instructions (Signed)
Medication as needed. ° °Take care ° °Dr. Theotis Gerdeman  °

## 2019-06-17 DIAGNOSIS — J069 Acute upper respiratory infection, unspecified: Secondary | ICD-10-CM | POA: Diagnosis not present

## 2019-06-17 DIAGNOSIS — Z8619 Personal history of other infectious and parasitic diseases: Secondary | ICD-10-CM | POA: Diagnosis not present

## 2019-06-20 ENCOUNTER — Other Ambulatory Visit: Payer: Self-pay | Admitting: Physician Assistant

## 2019-06-20 DIAGNOSIS — J189 Pneumonia, unspecified organism: Secondary | ICD-10-CM | POA: Diagnosis not present

## 2019-06-20 DIAGNOSIS — R05 Cough: Secondary | ICD-10-CM | POA: Diagnosis not present

## 2019-06-20 DIAGNOSIS — U071 COVID-19: Secondary | ICD-10-CM | POA: Diagnosis not present

## 2019-07-02 ENCOUNTER — Ambulatory Visit (INDEPENDENT_AMBULATORY_CARE_PROVIDER_SITE_OTHER): Payer: BC Managed Care – PPO | Admitting: Physician Assistant

## 2019-07-02 DIAGNOSIS — U071 COVID-19: Secondary | ICD-10-CM

## 2019-07-02 DIAGNOSIS — Z8619 Personal history of other infectious and parasitic diseases: Secondary | ICD-10-CM

## 2019-07-02 DIAGNOSIS — Z8616 Personal history of COVID-19: Secondary | ICD-10-CM

## 2019-07-02 DIAGNOSIS — J1282 Pneumonia due to coronavirus disease 2019: Secondary | ICD-10-CM

## 2019-07-02 DIAGNOSIS — J1289 Other viral pneumonia: Secondary | ICD-10-CM | POA: Diagnosis not present

## 2019-07-02 NOTE — Progress Notes (Signed)
Patient: Luis Ray Male    DOB: 13-Oct-1965   53 y.o.   MRN: [id number] Visit Date: 07/02/2019  Today's Provider: Trinna Post, PA-C   Chief Complaint  Patient presents with  . Follow-up   Subjective:    Virtual Visit via Telephone Note  I connected with Luis Ray on 07/02/19 at  2:20 PM EST by telephone and verified that I am speaking with the correct person using two identifiers.  Location: Patient: Home Provider: Office    I discussed the limitations, risks, security and privacy concerns of performing an evaluation and management service by telephone and the availability of in person appointments. I also discussed with the patient that there may be a patient responsible charge related to this service. The patient expressed understanding and agreed to proceed.  HPI  Follow-up Patient presents today virtually for follow-up about his cough after COVID. He tested positive for COVID on 06/08/2019 and states that he feels better and has went back to work. Diagnosed subsequently with pneumonia after COVID. Patient states that he needs to speak with Fabio Bering about what to expect after having COVID.   Allergies  Allergen Reactions  . Sulfa Antibiotics Other (See Comments)    Upset stomach     Current Outpatient Medications:  .  busPIRone (BUSPAR) 7.5 MG tablet, TAKE 1 TABLET (7.5 MG TOTAL) BY MOUTH 2 (TWO) TIMES DAILY., Disp: 180 tablet, Rfl: 0 .  cetirizine (ZYRTEC) 10 MG tablet, Take 10 mg by mouth daily., Disp: , Rfl:  .  chlorpheniramine-HYDROcodone (TUSSIONEX PENNKINETIC ER) 10-8 MG/5ML SUER, Take 5 mLs by mouth every 12 (twelve) hours as needed., Disp: 60 mL, Rfl: 0 .  finasteride (PROSCAR) 5 MG tablet, TAKE 1/4 TABLET BY MOUTH DAILY AS DIRECTED BY PHYSICIAN., Disp: 30 tablet, Rfl: 3 .  fluticasone (FLONASE) 50 MCG/ACT nasal spray, SPRAY 2 SPRAYS INTO EACH NOSTRIL EVERY DAY, Disp: 48 mL, Rfl: 0 .  pseudoephedrine (SUDAFED) 60 MG tablet, Take 60 mg by mouth  every 4 (four) hours as needed for congestion., Disp: , Rfl:  .  traZODone (DESYREL) 50 MG tablet, TAKE 1/2 TO 1 TABLET (25-50 MG TOTAL) BY MOUTH AT BEDTIME AS NEEDED FOR SLEEP., Disp: 90 tablet, Rfl: 2  Review of Systems  Constitutional: Negative for appetite change, chills, fatigue and fever.  HENT: Positive for congestion. Negative for sinus pressure, sinus pain and sore throat.   Respiratory: Positive for cough.   Neurological: Negative for headaches.    Social History   Tobacco Use  . Smoking status: Never Smoker  . Smokeless tobacco: Never Used  Substance Use Topics  . Alcohol use: Yes    Alcohol/week: 0.0 standard drinks    Comment: rare      Objective:   There were no vitals taken for this visit. There were no vitals filed for this visit.There is no height or weight on file to calculate BMI.   Physical Exam   No results found for any visits on 07/02/19.     Assessment & Plan    1. History of 2019 novel coronavirus disease (COVID-19)  Discussed course of COVID and post-COVID illness.  2. Pneumonia due to COVID-19 virus      I discussed the assessment and treatment plan with the patient. The patient was provided an opportunity to ask questions and all were answered. The patient agreed with the plan and demonstrated an understanding of the instructions.   The patient was advised to call  back or seek an in-person evaluation if the symptoms worsen or if the condition fails to improve as anticipated.  I provided 15 minutes of non-face-to-face time during this encounter.     [name], PA-C  [facility]

## 2019-07-03 ENCOUNTER — Other Ambulatory Visit: Payer: Self-pay | Admitting: Physician Assistant

## 2019-07-03 DIAGNOSIS — F419 Anxiety disorder, unspecified: Secondary | ICD-10-CM

## 2019-07-04 MED ORDER — BUSPIRONE HCL 7.5 MG PO TABS: 7.5000 mg | ORAL_TABLET | Freq: Two times a day (BID) | ORAL | 0 refills | Status: DC

## 2019-07-04 NOTE — Telephone Encounter (Signed)
L.O.V. was on 07/02/2019.

## 2019-07-08 ENCOUNTER — Encounter: Payer: Self-pay | Admitting: Emergency Medicine

## 2019-07-08 ENCOUNTER — Ambulatory Visit (INDEPENDENT_AMBULATORY_CARE_PROVIDER_SITE_OTHER): Payer: BC Managed Care – PPO

## 2019-07-08 ENCOUNTER — Ambulatory Visit
Admission: EM | Admit: 2019-07-08 | Discharge: 2019-07-08 | Disposition: A | Discharge status: Home / Self Care | Payer: BC Managed Care – PPO | Attending: Urgent Care | Admitting: Urgent Care

## 2019-07-08 ENCOUNTER — Other Ambulatory Visit: Payer: Self-pay

## 2019-07-08 DIAGNOSIS — R05 Cough: Secondary | ICD-10-CM

## 2019-07-08 DIAGNOSIS — R0602 Shortness of breath: Secondary | ICD-10-CM

## 2019-07-08 DIAGNOSIS — J189 Pneumonia, unspecified organism: Secondary | ICD-10-CM | POA: Diagnosis not present

## 2019-07-08 DIAGNOSIS — R059 Cough, unspecified: Secondary | ICD-10-CM

## 2019-07-08 DIAGNOSIS — Z8616 Personal history of COVID-19: Secondary | ICD-10-CM

## 2019-07-08 DIAGNOSIS — Z8619 Personal history of other infectious and parasitic diseases: Secondary | ICD-10-CM

## 2019-07-08 MED ORDER — LEVOFLOXACIN 750 MG PO TABS: 750.0000 mg | ORAL_TABLET | Freq: Every day | ORAL | 0 refills | Status: AC

## 2019-07-08 MED ORDER — BENZONATATE 100 MG PO CAPS: 100.0000 mg | ORAL_CAPSULE | Freq: Three times a day (TID) | ORAL | 0 refills | Status: DC

## 2019-07-08 NOTE — ED Triage Notes (Signed)
Patient c/o ongoing cough and chest congestion since 06/13/19.  Patient was potivie for COVID back in November and was treated for pneumonia.  Patient denies fevers.

## 2019-07-08 NOTE — ED Provider Notes (Signed)
[name], [place]   Name: Luis Ray DOB: 03-29-1966 MRN: [id number] CSN: [id number] PCP: Trinna Post, PA-C  Arrival date and time:  07/08/19 4268  Chief Complaint:  Cough   NOTE: Prior to seeing the patient today, I have reviewed the triage nursing documentation and vital signs. Clinical staff has updated patient's PMH/PSHx, current medication list, and drug allergies/intolerances to ensure comprehensive history available to assist in medical decision making.   History:   HPI: Luis Ray is a 52 y.o. male who presents today with complaints of cough and chest congestion. Patient reporting that he was diagnosed with SARS-CoV-2 (novel coronavirus) back in November. He was released from quarantine on 06/18/2019, however was still having cough. Patient reports that he went to [facility] and was diagnosed with BILATERAL post-COVID pneumonia for which he was prescribed courses of amoxicillin-clavulanate, azithromycin, and steroids. Patient reports that he began to feel better after while on the antibiotics, however the cough has persisted. Cough continues to be productive of clear/white sputum. Patient reports periods of feeling weak and lightheaded at this point. He denies any significant shortness of breath. He is not having fevers or pleuritic chest pain. Patient has a friend who is a nurse who advised him that she feels as if he still has pneumonia based on the way that he sounds. He was advised to come in for repeat radiographs of his chest. Patient does not smoke.   Past Medical History:  Diagnosis Date   Alopecia    History of 2019 novel coronavirus disease (COVID-19) 06/2019   Insomnia     Past Surgical History:  Procedure Laterality Date   ROTATOR CUFF REPAIR Right     Family History  Problem Relation Age of Onset   Coronary artery disease Father     Social History   Tobacco Use   Smoking status: Never Smoker   Smokeless tobacco: Never Used  Substance  Use Topics   Alcohol use: Yes    Alcohol/week: 0.0 standard drinks    Comment: rare   Drug use: No    Patient Active Problem List   Diagnosis Date Noted   GERD (gastroesophageal reflux disease) 08/31/2016   Hyperglycemia 09/16/2015   Chronic anxiety 10/10/2008   Herpes 08/02/2004   Allergic rhinitis 08/02/2002   Hypercholesterolemia without hypertriglyceridemia 02/07/2001   Alopecia, male pattern 08/02/2000    Home Medications:    Current Meds  Medication Sig   busPIRone (BUSPAR) 7.5 MG tablet Take 1 tablet (7.5 mg total) by mouth 2 (two) times daily.   cetirizine (ZYRTEC) 10 MG tablet Take 10 mg by mouth daily.   finasteride (PROSCAR) 5 MG tablet TAKE 1/4 TABLET BY MOUTH DAILY AS DIRECTED BY PHYSICIAN.   fluticasone (FLONASE) 50 MCG/ACT nasal spray SPRAY 2 SPRAYS INTO EACH NOSTRIL EVERY DAY   traZODone (DESYREL) 50 MG tablet TAKE 1/2 TO 1 TABLET (25-50 MG TOTAL) BY MOUTH AT BEDTIME AS NEEDED FOR SLEEP.    Allergies:   Sulfa antibiotics  Review of Systems (ROS): Review of Systems  Constitutional: Positive for fatigue. Negative for chills and fever.  HENT: Negative for congestion, ear pain, postnasal drip, rhinorrhea, sinus pressure, sinus pain, sneezing and sore throat.   Eyes: Negative for pain, discharge, redness and visual disturbance.  Respiratory: Positive for cough (productive; clear/white sputum) and shortness of breath (increases significantly with exertion). Negative for chest tightness.   Cardiovascular: Negative for chest pain and palpitations.  Gastrointestinal: Negative for abdominal pain, diarrhea, nausea and vomiting.  Genitourinary: Negative for dysuria and hematuria.  Musculoskeletal: Negative for arthralgias, back pain, myalgias and neck pain.  Skin: Negative for color change, pallor and rash.  Neurological: Positive for weakness (intermittent episodes) and light-headedness (intermittent). Negative for dizziness, seizures, syncope and  headaches.  Hematological: Negative for adenopathy.  All other systems reviewed and are negative.    Vital Signs: Today's Vitals   07/08/19 0833 07/08/19 0836 07/08/19 0934  BP:  113/74   Pulse:  73   Resp:  16   Temp:  98 F (36.7 C)   TempSrc:  Oral   SpO2:  99%   Weight: 214 lb 15.2 oz (97.5 kg)    Height: 5\' 10"  (1.778 m)    PainSc: 0-No pain  0-No pain    Physical Exam: Physical Exam  Constitutional: He is oriented to person, place, and time and well-developed, well-nourished, and in no distress.  HENT:  Head: Normocephalic and atraumatic.  Nose: Nose normal.  Mouth/Throat: Uvula is midline and mucous membranes are normal. Posterior oropharyngeal erythema present. No oropharyngeal exudate or posterior oropharyngeal edema.  Eyes: Pupils are equal, round, and reactive to light.  Neck: Normal range of motion. Neck supple.  Cardiovascular: Normal rate, regular rhythm, normal heart sounds and intact distal pulses.  Pulmonary/Chest: Effort normal. He has decreased breath sounds in the right lower field and the left lower field. He has rhonchi (scattered).  Deep cough in clinic. SPO2 99% on RA.   Musculoskeletal: Normal range of motion.  Neurological: He is alert and oriented to person, place, and time. Gait normal.  Skin: Skin is warm and dry. No rash noted. He is not diaphoretic.  Psychiatric: Mood, memory, affect and judgment normal.  Nursing note and vitals reviewed.   Urgent Care Treatments / Results:   LABS: PLEASE NOTE: all labs that were ordered this encounter are listed, however only abnormal results are displayed. Labs Reviewed - No data to display  EKG: -None  RADIOLOGY: Dg Chest 2 View  Result Date: 07/08/2019 CLINICAL DATA:  Productive cough. Previous COVID pneumonia. EXAM: CHEST - 2 VIEW COMPARISON:  09/16/2015 FINDINGS: The heart size and mediastinal contours are within normal limits. A few subtle patchy areas of opacity are seen in the peripheral lung  zones bilaterally, suspicious for atypical/viral pneumonia. No evidence of pulmonary consolidation or pleural effusion. IMPRESSION: Findings suspicious for atypical/viral pneumonia. No evidence of pulmonary consolidation. Electronically Signed   By: Danae OrleansJohn A Stahl M.D.   On: 07/08/2019 09:18    PROCEDURES: Procedures  MEDICATIONS RECEIVED THIS VISIT: Medications - No data to display  PERTINENT CLINICAL COURSE NOTES/UPDATES:   Initial Impression / Assessment and Plan / Urgent Care Course:  Pertinent labs & imaging results that were available during my care of the patient were personally reviewed by me and considered in my medical decision making (see lab/imaging section of note for values and interpretations).  Virgel Gesserry L Laury is a 53 y.o. male who [facility] today with complaints of Cough   Patient is well appearing overall in clinic today. He does not appear to be in any acute distress. Presenting symptoms (see HPI) and exam as documented above. Radiographs of the chest performed today reveal patchy opacities BILATERALLY consistent with atypical PNA. Patient is s/p treatment with amoxicillin-clavulanate + azithromycin. He continues to have significant cough. Initial film was performed at an outside agency and is therefore unavailable for review. Patient reporting significant exertional dyspnea and fatigue. Given symptoms and findings on chest film, will  cover patient with a 7 day course of levofloxacin. Cough reported to be worse at night. Will provide patient with a supply of benzonatate for PRN use to help with the cough. Discussed supportive care measures at home. Patient to rest as much as possible. He was encouraged to ensure adequate hydration. Patient may use APAP and/or IBU on an as needed basis for pain/fever.   Discussed follow up with primary care physician in 1 week for re-evaluation. I have reviewed the follow up and strict return precautions for any new or worsening  symptoms. Patient is aware of symptoms that would be deemed urgent/emergent, and would thus require further evaluation either here or in the emergency department. At the time of discharge, he verbalized understanding and consent with the discharge plan as it was reviewed with him. All questions were fielded by provider and/or clinic staff prior to patient discharge.    Final Clinical Impressions / Urgent Care Diagnoses:   Final diagnoses:  Community acquired pneumonia, unspecified laterality  Cough  History of 2019 novel coronavirus disease (COVID-19)    New Prescriptions:  [place] Controlled Substance Registry consulted? Not Applicable  Meds ordered this encounter  Medications   levofloxacin (LEVAQUIN) 750 MG tablet    Sig: Take 1 tablet (750 mg total) by mouth daily for 7 days.    Dispense:  7 tablet    Refill:  0   benzonatate (TESSALON) 100 MG capsule    Sig: Take 1 capsule (100 mg total) by mouth every 8 (eight) hours.    Dispense:  21 capsule    Refill:  0    Recommended Follow up Care:  Patient encouraged to follow up with the following provider within the specified time frame, or sooner as dictated by the severity of his symptoms. As always, he was instructed that for any urgent/emergent care needs, he should seek care either here or in the emergency department for more immediate evaluation.  Follow-up Information    [name], PA-C In 1 week.   Specialty: Physician Assistant Why: General reassessment of symptoms if not improving Contact information: [address] [place] [address] [phone]         NOTE: This note was prepared using Dragon dictation software along with smaller phrase technology. Despite my best ability to proofread, there is the potential that transcriptional errors may still occur from this process, and are completely unintentional.    Verlee Monte, NP 07/08/19 2359

## 2019-07-08 NOTE — Discharge Instructions (Signed)
It was very nice seeing you today in clinic. Thank you for entrusting me with your care.   REST and make sure you are staying hydrated. Will try a different antibiotic for your symptoms given the persistence of your symptoms. Will also send in some medication for your cough.   Make arrangements to follow up with your regular doctor in 1 week for re-evaluation if not improving.  If your symptoms/condition worsens, please seek follow up care either here or in the ER. Please remember, our [facility] providers are "right here with you" when you need [place].   Again, it was my pleasure to take care of you today. Thank you for choosing our clinic. I hope that you start to feel better quickly.   Honor Loh, MSN, APRN, FNP-C, CEN Advanced Practice Provider [facility]

## 2019-08-09 ENCOUNTER — Ambulatory Visit: Payer: Self-pay

## 2019-08-09 NOTE — Telephone Encounter (Signed)
Pt. Reports he had COVID 19 in November and pneumonia in December. Having chest pain on and off 2-3 weeks. Hurts in the middle of his chest, does not radiate."Dull tightness." Pain is 2-3/10. No shortness of breath, nausea or sweating. No pain currently. No availability in the office. Pt. Will go to UC at [facility] today.  Reason for Disposition . [1] Chest pain lasts > 5 minutes AND [2] occurred > 3 days ago (72 hours) AND [3] NO chest pain or cardiac symptoms now  Answer Assessment - Initial Assessment Questions 1. LOCATION: "Where does it hurt?"       Hurts middle 2. RADIATION: "Does the pain go anywhere else?" (e.g., into neck, jaw, arms, back)     No 3. ONSET: "When did the chest pain begin?" (Minutes, hours or days)      Started 2-3 weeks 4. PATTERN "Does the pain come and go, or has it been constant since it started?"  "Does it get worse with exertion?"      Comes and goes 5. DURATION: "How long does it last" (e.g., seconds, minutes, hours)     Can last up to 3 hours 6. SEVERITY: "How bad is the pain?"  (e.g., Scale 1-10; mild, moderate, or severe)    - MILD (1-3): doesn't interfere with normal activities     - MODERATE (4-7): interferes with normal activities or awakens from sleep    - SEVERE (8-10): excruciating pain, unable to do any normal activities        2-3 7. CARDIAC RISK FACTORS: "Do you have any history of heart problems or risk factors for heart disease?" (e.g., angina, prior heart attack; diabetes, high blood pressure, high cholesterol, smoker, or strong family history of heart disease)     Heart disease in the family 8. PULMONARY RISK FACTORS: "Do you have any history of lung disease?"  (e.g., blood clots in lung, asthma, emphysema, birth control pills)     COVID 19 9. CAUSE: "What do you think is causing the chest pain?"     Unsure 10. OTHER SYMPTOMS: "Do you have any other symptoms?" (e.g., dizziness, nausea, vomiting, sweating, fever, difficulty breathing, cough)       No 11. PREGNANCY: "Is there any chance you are pregnant?" "When was your last menstrual period?"       n/a  Protocols used: CHEST PAIN-A-AH

## 2019-08-09 NOTE — Telephone Encounter (Signed)
FYI, from [facility], patient going to [facility]

## 2019-11-20 ENCOUNTER — Encounter: Payer: Self-pay | Admitting: Cardiovascular Disease

## 2019-11-20 ENCOUNTER — Ambulatory Visit: Payer: BC Managed Care – PPO | Admitting: Physician Assistant

## 2019-11-20 ENCOUNTER — Other Ambulatory Visit: Payer: Self-pay

## 2019-11-20 ENCOUNTER — Encounter: Payer: Self-pay | Admitting: Physician Assistant

## 2019-11-20 ENCOUNTER — Ambulatory Visit (INDEPENDENT_AMBULATORY_CARE_PROVIDER_SITE_OTHER): Payer: BC Managed Care – PPO | Admitting: Cardiovascular Disease

## 2019-11-20 VITALS — BP 128/80 | HR 58 | Temp 97.1°F | Resp 16 | Ht 70.0 in | Wt 215.0 lb

## 2019-11-20 VITALS — BP 120/72 | HR 63 | Ht 70.0 in | Wt 215.5 lb

## 2019-11-20 DIAGNOSIS — R072 Precordial pain: Secondary | ICD-10-CM

## 2019-11-20 DIAGNOSIS — E785 Hyperlipidemia, unspecified: Secondary | ICD-10-CM

## 2019-11-20 DIAGNOSIS — I493 Ventricular premature depolarization: Secondary | ICD-10-CM | POA: Diagnosis not present

## 2019-11-20 DIAGNOSIS — F419 Anxiety disorder, unspecified: Secondary | ICD-10-CM

## 2019-11-20 DIAGNOSIS — R0789 Other chest pain: Secondary | ICD-10-CM

## 2019-11-20 DIAGNOSIS — R002 Palpitations: Secondary | ICD-10-CM | POA: Diagnosis not present

## 2019-11-20 LAB — EKG 12-LEAD

## 2019-11-20 MED ORDER — METOPROLOL TARTRATE 25 MG PO TABS: 12.5000 mg | ORAL_TABLET | Freq: Two times a day (BID) | ORAL | 0 refills | Status: DC

## 2019-11-20 MED ORDER — METOPROLOL TARTRATE 50 MG PO TABS: ORAL_TABLET | ORAL | 0 refills | Status: DC

## 2019-11-20 NOTE — Progress Notes (Signed)
Established patient visit  I,Chyan Carnero M Laurielle Selmon,acting as a scribe for [name], PA-C.,have documented all relevant documentation on the behalf of [name], PA-C,as directed by  [name], PA-C while in the presence of [name], PA-C.   Patient: Luis Ray   DOB: Oct 15, 1965   54 y.o. Male  MRN: [id number] Visit Date: 11/20/2019  Today's healthcare provider: [name], PA-C   Chief Complaint  Patient presents with  . Chest Pain   Subjective    Chest Pain  This is a new problem. The current episode started in the past 7 days. The onset quality is sudden. The problem occurs intermittently. The problem has been gradually improving. The pain is at a severity of 2/10. The quality of the pain is described as dull and tightness. The pain does not radiate. Pertinent negatives include no abdominal pain, dizziness, nausea, palpitations, syncope or vomiting. The pain is aggravated by nothing. He has tried rest (aspirin) for the symptoms. The treatment provided no relief. Risk factors include male gender and stress.  His past medical history is significant for anxiety/panic attacks.  Palpitations  This is a new problem. The current episode started in the past 7 days. The problem occurs intermittently. The problem has been waxing and waning. Nothing aggravates the symptoms. Associated symptoms include chest pain. Pertinent negatives include no dizziness, nausea, syncope or vomiting. He has tried nothing for the symptoms. Risk factors include being male, dyslipidemia, stress, family history and obesity. His past medical history is significant for anxiety.  Anxiety Presents for follow-up visit. Symptoms include chest pain and excessive worry. Patient reports no dizziness, nausea or palpitations. Symptoms occur most days.   His past medical history is significant for anxiety/panic attacks. Compliance with medications is 76-100%.        Medications: Outpatient  Medications Prior to Visit  Medication Sig  . aspirin EC 81 MG tablet Take 81 mg by mouth daily.  . cetirizine (ZYRTEC) 10 MG tablet Take 10 mg by mouth daily.  . clonazePAM (KLONOPIN) 0.5 MG tablet clonazepam 0.5 mg tablet  . finasteride (PROSCAR) 5 MG tablet TAKE 1/4 TABLET BY MOUTH DAILY AS DIRECTED BY PHYSICIAN.  . fluticasone (FLONASE) 50 MCG/ACT nasal spray SPRAY 2 SPRAYS INTO EACH NOSTRIL EVERY DAY  . traZODone (DESYREL) 50 MG tablet TAKE 1/2 TO 1 TABLET (25-50 MG TOTAL) BY MOUTH AT BEDTIME AS NEEDED FOR SLEEP.  Marland Kitchen albuterol (VENTOLIN HFA) 108 (90 Base) MCG/ACT inhaler SMARTSIG:2 Puff(s) By Mouth Every 4 Hours PRN  . valACYclovir (VALTREX) 1000 MG tablet Take 1,000 mg by mouth 2 (two) times daily.  . [DISCONTINUED] benzonatate (TESSALON) 100 MG capsule Take 1 capsule (100 mg total) by mouth every 8 (eight) hours.  . [DISCONTINUED] influenza vac split quadrivalent PF (FLUZONE QUADRIVALENT) 0.5 ML injection Fluzone Quad 2019-2020 (PF) 60 mcg (15 mcg x 4)/0.5 mL IM syringe  TO BE ADMINISTERED BY PHARMACIST FOR IMMUNIZATION  . [DISCONTINUED] pseudoephedrine (SUDAFED) 60 MG tablet Take 60 mg by mouth every 4 (four) hours as needed for congestion.  . [DISCONTINUED] SOOLANTRA 1 % CREA APPLY TOPICALLY TO THE AFFECTED AREA OF THE FACE EVERY DAY   No facility-administered medications prior to visit.    Review of Systems  Constitutional: Negative.   HENT: Negative.   Eyes: Negative.   Respiratory: Negative.   Cardiovascular: Positive for chest pain. Negative for palpitations and syncope.  Gastrointestinal: Negative for abdominal pain, nausea and vomiting.  Endocrine: Negative.  Genitourinary: Negative.   Musculoskeletal: Negative.   Skin: Negative.   Allergic/Immunologic: Negative.   Neurological: Negative for dizziness, syncope and light-headedness.  Hematological: Negative.   Psychiatric/Behavioral: Negative.        Objective    BP 128/80   Pulse (!) 58   Temp (!) 97.1 F  (36.2 C) (Temporal)   Resp 16   Ht 5\' 10"  (1.778 m)   Wt 215 lb (97.5 kg)   SpO2 96%   BMI 30.85 kg/m    Physical Exam Constitutional:      Appearance: Normal appearance.  Cardiovascular:     Rate and Rhythm: Normal rate. Rhythm irregular.     Heart sounds: Normal heart sounds.  Pulmonary:     Effort: Pulmonary effort is normal.     Breath sounds: Normal breath sounds.  Musculoskeletal:     Right lower leg: No edema.     Left lower leg: No edema.  Skin:    General: Skin is warm and dry.  Neurological:     Mental Status: He is alert and oriented to person, place, and time.  Psychiatric:        Mood and Affect: Mood normal.        Behavior: Behavior normal.       Results for orders placed or performed in visit on 11/20/19  EKG 12-Lead  Result Value Ref Range   Special Stains       Assessment & Plan    1. Chest discomfort Few PVC's. Patient denies Chest pain today. Will start on low does metoprolol and refer urgently to cardiology. Return precautions counseled. - EKG 12-Lead - EKG 12-Lead - Comprehensive Metabolic Panel (CMET) - CBC with Differential - TSH - Lipid Profile - Magnesium - metoprolol tartrate (LOPRESSOR) 25 MG tablet; Take 0.5 tablets (12.5 mg total) by mouth 2 (two) times daily.  Dispense: 90 tablet; Refill: 0 - Ambulatory referral to Cardiology  2. Palpitations  - Comprehensive Metabolic Panel (CMET) - CBC with Differential - TSH - Lipid Profile - Magnesium - metoprolol tartrate (LOPRESSOR) 25 MG tablet; Take 0.5 tablets (12.5 mg total) by mouth 2 (two) times daily.  Dispense: 90 tablet; Refill: 0 - Ambulatory referral to Cardiology  3. Chronic anxiety Increase nightly buspar dose to 15mg . Continue taking 7.5 mg in the morning.    Return if symptoms worsen or fail to improve.      ITrinna Post, PA-C, have reviewed all documentation for this visit. The documentation on 11/20/19 for the exam, diagnosis, procedures, and orders are  all accurate and complete.    Paulene Floor  [facility] [phone] (phone) [phone] (fax)  [place]

## 2019-11-20 NOTE — Patient Instructions (Addendum)
A referral for cardiology has been placed. They  Will contact you with appointment details. Start taking the metoprolol tonight. Be aware of it possibly lowering your pulse. We will contact you about your lab work collected today.    Metoprolol Tablets What is this medicine? METOPROLOL (me TOE proe lole) is a beta blocker. It decreases the amount of work your heart has to do and helps your heart beat regularly. It is used to treat high blood pressure and/or prevent chest pain (also called angina). It is also used after a heart attack to prevent a second one. This medicine may be used for other purposes; ask your health care provider or pharmacist if you have questions. COMMON BRAND NAME(S): Lopressor What should I tell my health care provider before I take this medicine? They need to know if you have any of these conditions:  diabetes  heart or vessel disease like slow heart rate, worsening heart failure, heart block, sick sinus syndrome or Raynaud's disease  kidney disease  liver disease  lung or breathing disease, like asthma or emphysema  pheochromocytoma  thyroid disease  an unusual or allergic reaction to metoprolol, other beta-blockers, medicines, foods, dyes, or preservatives  pregnant or trying to get pregnant  breast-feeding How should I use this medicine? Take this drug by mouth with water. Take it as directed on the prescription label at the same time every day. You can take it with or without food. You should always take it the same way. Keep taking it unless your health care provider tells you to stop. Talk to your health care provider about the use of this drug in children. Special care may be needed. Overdosage: If you think you have taken too much of this medicine contact a poison control center or emergency room at once. NOTE: This medicine is only for you. Do not share this medicine with others. What if I miss a dose? If you miss a dose, take it as soon as you  can. If it is almost time for your next dose, take only that dose. Do not take double or extra doses. What may interact with this medicine? This medicine may interact with the following medications:  certain medicines for blood pressure, heart disease, irregular heart beat  certain medicines for depression like monoamine oxidase (MAO) inhibitors, fluoxetine, or paroxetine  clonidine  dobutamine  epinephrine  isoproterenol  reserpine This list may not describe all possible interactions. Give your health care provider a list of all the medicines, herbs, non-prescription drugs, or dietary supplements you use. Also tell them if you smoke, drink alcohol, or use illegal drugs. Some items may interact with your medicine. What should I watch for while using this medicine? Visit your doctor or health care professional for regular check ups. Contact your doctor right away if your symptoms worsen. Check your blood pressure and pulse rate regularly. Ask your health care professional what your blood pressure and pulse rate should be, and when you should contact them. You may get drowsy or dizzy. Do not drive, use machinery, or do anything that needs mental alertness until you know how this medicine affects you. Do not sit or stand up quickly, especially if you are an older patient. This reduces the risk of dizzy or fainting spells. Contact your doctor if these symptoms continue. Alcohol may interfere with the effect of this medicine. Avoid alcoholic drinks. This medicine may increase blood sugar. Ask your healthcare provider if changes in diet or medicines are needed  if you have diabetes. What side effects may I notice from receiving this medicine? Side effects that you should report to your doctor or health care professional as soon as possible:  allergic reactions like skin rash, itching or hives  cold or numb hands or feet  depression  difficulty breathing  faint  fever with sore  throat  irregular heartbeat, chest pain  rapid weight gain   signs and symptoms of high blood sugar such as being more thirsty or hungry or having to urinate more than normal. You may also feel very tired or have blurry vision.  swollen legs or ankles Side effects that usually do not require medical attention (report to your doctor or health care professional if they continue or are bothersome):  anxiety or nervousness  change in sex drive or performance  dry skin  headache  nightmares or trouble sleeping  short term memory loss  stomach upset or diarrhea This list may not describe all possible side effects. Call your doctor for medical advice about side effects. You may report side effects to FDA at 1-800-FDA-1088. Where should I keep my medicine? Keep out of the reach of children and pets. Store at room temperature between 15 and 30 degrees C (59 and 86 degrees F). Protect from moisture. Keep the container tightly closed. Throw away any unused drug after the expiration date. NOTE: This sheet is a summary. It may not cover all possible information. If you have questions about this medicine, talk to your doctor, pharmacist, or health care provider.  2020 Elsevier/Gold Standard (2019-03-01 17:21:17) Bradycardia, Adult Bradycardia is a slower-than-normal heartbeat. A normal resting heart rate for an adult ranges from 60 to 100 beats per minute. With bradycardia, the resting heart rate is less than 60 beats per minute. Bradycardia can prevent enough oxygen from reaching certain areas of your body when you are active. It can be serious if it keeps enough oxygen from reaching your brain and other parts of your body. Bradycardia is not a problem for everyone. For some healthy adults, a slow resting heart rate is normal. What are the causes? This condition may be caused by:  A problem with the heart, including: ? A problem with the heart's electrical system, such as a heart block.  With a heart block, electrical signals between the chambers of the heart are partially or completely blocked, so they are not able to work as they should. ? A problem with the heart's natural pacemaker (sinus node). ? Heart disease. ? A heart attack. ? Heart damage. ? Lyme disease. ? A heart infection. ? A heart condition that is present at birth (congenital heart defect).  Certain medicines that treat heart conditions.  Certain conditions, such as hypothyroidism and obstructive sleep apnea.  Problems with the balance of chemicals and other substances, like potassium, in the blood.  Trauma.  Radiation therapy. What increases the risk? You are more likely to develop this condition if you:  Are age 86 or older.  Have high blood pressure (hypertension), high cholesterol (hyperlipidemia), or diabetes.  Drink heavily, use tobacco or nicotine products, or use drugs. What are the signs or symptoms? Symptoms of this condition include:  Light-headedness.  Feeling faint or fainting.  Fatigue and weakness.  Trouble with activity or exercise.  Shortness of breath.  Chest pain (angina).  Drowsiness.  Confusion.  Dizziness. How is this diagnosed? This condition may be diagnosed based on:  Your symptoms.  Your medical history.  A physical exam. During  the exam, your health care provider will listen to your heartbeat and check your pulse. To confirm the diagnosis, your health care provider may order tests, such as:  Blood tests.  An electrocardiogram (ECG). This test records the heart's electrical activity. The test can show how fast your heart is beating and whether the heartbeat is steady.  A test in which you wear a portable device (event recorder or Holter monitor) to record your heart's electrical activity while you go about your day.  Anexercise test. How is this treated? Treatment for this condition depends on the cause of the condition and how severe your  symptoms are. Treatment may involve:  Treatment of the underlying condition.  Changing your medicines or how much medicine you take.  Having a small, battery-operated device called a pacemaker implanted under the skin. When bradycardia occurs, this device can be used to increase your heart rate and help your heart beat in a regular rhythm. Follow these instructions at home: Lifestyle   Manage any health conditions that contribute to bradycardia as told by your health care provider.  Follow a heart-healthy diet. A nutrition specialist (dietitian) can help educate you about healthy food options and changes.  Follow an exercise program that is approved by your health care provider.  Maintain a healthy weight.  Try to reduce or manage your stress, such as with yoga or meditation. If you need help reducing stress, ask your health care provider.  Do not use any products that contain nicotine or tobacco, such as cigarettes, e-cigarettes, and chewing tobacco. If you need help quitting, ask your health care provider.  Do not use illegal drugs.  Limit alcohol intake to no more than 1 drink a day for nonpregnant women and 2 drinks a day for men. Be aware of how much alcohol is in your drink. In the [place] one drink equals one 12 oz bottle of beer (355 mL), one 5 oz glass of wine (148 mL), or one 1 oz glass of hard liquor (44 mL). General instructions  Take over-the-counter and prescription medicines only as told by your health care provider.  Keep all follow-up visits as told by your health care provider. This is important. How is this prevented? In some cases, bradycardia may be prevented by:  Treating underlying medical problems.  Stopping behaviors or medicines that can trigger the condition. Contact a health care provider if you:  Feel light-headed or dizzy.  Almost faint.  Feel weak or are easily fatigued during physical activity.  Experience confusion or have memory  problems. Get help right away if:  You faint.  You have: ? An irregular heartbeat (palpitations). ? Chest pain. ? Trouble breathing. Summary  Bradycardia is a slower-than-normal heartbeat. With bradycardia, the resting heart rate is less than 60 beats per minute.  Treatment for this condition depends on the cause.  Manage any health conditions that contribute to bradycardia as told by your health care provider.  Do not use any products that contain nicotine or tobacco, such as cigarettes, e-cigarettes, and chewing tobacco, and limit alcohol intake.  Keep all follow-up visits as told by your health care provider. This is important. This information is not intended to replace advice given to you by your health care provider. Make sure you discuss any questions you have with your health care provider. Document Revised: 01/30/2018 Document Reviewed: 12/28/2017 Elsevier Patient Education  [place]. Palpitations Palpitations are feelings that your heartbeat is not normal. Your heartbeat may feel like it  is:  Uneven.  Faster than normal.  Fluttering.  Skipping a beat. This is usually not a serious problem. In some cases, you may need tests to rule out any serious problems. Follow these instructions at home: Pay attention to any changes in your condition. Take these actions to help manage your symptoms: Eating and drinking  Avoid: ? Coffee, tea, soft drinks, and energy drinks. ? Chocolate. ? Alcohol. ? Diet pills. Lifestyle   Try to lower your stress. These things can help you relax: ? Yoga. ? Deep breathing and meditation. ? Exercise. ? Using words and images to create positive thoughts (guided imagery). ? Using your mind to control things in your body (biofeedback).  Do not use drugs.  Get plenty of rest and sleep. Keep a regular bed time. General instructions   Take over-the-counter and prescription medicines only as told by your doctor.  Do not use  any products that contain nicotine or tobacco, such as cigarettes and e-cigarettes. If you need help quitting, ask your doctor.  Keep all follow-up visits as told by your doctor. This is important. You may need more tests if palpitations do not go away or get worse. Contact a doctor if:  Your symptoms last more than 24 hours.  Your symptoms occur more often. Get help right away if you:  Have chest pain.  Feel short of breath.  Have a very bad headache.  Feel dizzy.  Pass out (faint). Summary  Palpitations are feelings that your heartbeat is uneven or faster than normal. It may feel like your heart is fluttering or skipping a beat.  Avoid food and drinks that may cause palpitations. These include caffeine, chocolate, and alcohol.  Try to lower your stress. Do not smoke or use drugs.  Get help right away if you faint or have chest pain, shortness of breath, a severe headache, or dizziness. This information is not intended to replace advice given to you by your health care provider. Make sure you discuss any questions you have with your health care provider. Document Revised: 08/31/2017 Document Reviewed: 08/31/2017 Elsevier Patient Education  2020 [employer] Following a healthy eating pattern may help you to achieve and maintain a healthy body weight, reduce the risk of chronic disease, and live a long and productive life. It is important to follow a healthy eating pattern at an appropriate calorie level for your body. Your nutritional needs should be met primarily through food by choosing a variety of nutrient-rich foods. What are tips for following this plan? Reading food labels  Read labels and choose the following: ? Reduced or low sodium. ? Juices with 100% fruit juice. ? Foods with low saturated fats and high polyunsaturated and monounsaturated fats. ? Foods with whole grains, such as whole wheat, cracked wheat, brown rice, and wild rice. ? Whole  grains that are fortified with folic acid. This is recommended for women who are pregnant or who want to become pregnant.  Read labels and avoid the following: ? Foods with a lot of added sugars. These include foods that contain brown sugar, corn sweetener, corn syrup, dextrose, fructose, glucose, high-fructose corn syrup, honey, invert sugar, lactose, malt syrup, maltose, molasses, raw sugar, sucrose, trehalose, or turbinado sugar.  Do not eat more than the following amounts of added sugar per day:  6 teaspoons (25 g) for women.  9 teaspoons (38 g) for men. ? Foods that contain processed or refined starches and grains. ? Refined grain products, such as  white flour, degermed cornmeal, white bread, and white rice. Shopping  Choose nutrient-rich snacks, such as vegetables, whole fruits, and nuts. Avoid high-calorie and high-sugar snacks, such as potato chips, fruit snacks, and candy.  Use oil-based dressings and spreads on foods instead of solid fats such as butter, stick margarine, or cream cheese.  Limit pre-made sauces, mixes, and "instant" products such as flavored rice, instant noodles, and ready-made pasta.  Try more plant-protein sources, such as tofu, tempeh, black beans, edamame, lentils, nuts, and seeds.  Explore eating plans such as the Mediterranean diet or vegetarian diet. Cooking  Use oil to saut or stir-fry foods instead of solid fats such as butter, stick margarine, or lard.  Try baking, boiling, grilling, or broiling instead of frying.  Remove the fatty part of meats before cooking.  Steam vegetables in water or broth. Meal planning   At meals, imagine dividing your plate into fourths: ? One-half of your plate is fruits and vegetables. ? One-fourth of your plate is whole grains. ? One-fourth of your plate is protein, especially lean meats, poultry, eggs, tofu, beans, or nuts.  Include low-fat dairy as part of your daily diet. Lifestyle  Choose healthy  options in all settings, including home, work, school, restaurants, or stores.  Prepare your food safely: ? Wash your hands after handling raw meats. ? Keep food preparation surfaces clean by regularly washing with hot, soapy water. ? Keep raw meats separate from ready-to-eat foods, such as fruits and vegetables. ? Cook seafood, meat, poultry, and eggs to the recommended internal temperature. ? Store foods at safe temperatures. In general:  Keep cold foods at 40F (4.4C) or below.  Keep hot foods at 140F (60C) or above.  Keep your freezer at [facility] (-17.8C) or below.  Foods are no longer safe to eat when they have been between the temperatures of 40-140F (4.4-60C) for more than 2 hours. What foods should I eat? Fruits Aim to eat 2 cup-equivalents of fresh, canned (in natural juice), or frozen fruits each day. Examples of 1 cup-equivalent of fruit include 1 small apple, 8 large strawberries, 1 cup canned fruit,  cup dried fruit, or 1 cup 100% juice. Vegetables Aim to eat 2-3 cup-equivalents of fresh and frozen vegetables each day, including different varieties and colors. Examples of 1 cup-equivalent of vegetables include 2 medium carrots, 2 cups raw, leafy greens, 1 cup chopped vegetable (raw or cooked), or 1 medium baked potato. Grains Aim to eat 6 ounce-equivalents of whole grains each day. Examples of 1 ounce-equivalent of grains include 1 slice of bread, 1 cup ready-to-eat cereal, 3 cups popcorn, or  cup cooked rice, pasta, or cereal. Meats and other proteins Aim to eat 5-6 ounce-equivalents of protein each day. Examples of 1 ounce-equivalent of protein include 1 egg, 1/2 cup nuts or seeds, or 1 tablespoon (16 g) peanut butter. A cut of meat or fish that is the size of a deck of cards is about 3-4 ounce-equivalents.  Of the protein you eat each week, try to have at least 8 ounces come from seafood. This includes salmon, trout, herring, and anchovies. Dairy Aim to eat 3  cup-equivalents of fat-free or low-fat dairy each day. Examples of 1 cup-equivalent of dairy include 1 cup (240 mL) milk, 8 ounces (250 g) yogurt, 1 ounces (44 g) natural cheese, or 1 cup (240 mL) fortified soy milk. Fats and oils  Aim for about 5 teaspoons (21 g) per day. Choose monounsaturated fats, such as canola and olive oils,  avocados, peanut butter, and most nuts, or polyunsaturated fats, such as sunflower, corn, and soybean oils, walnuts, pine nuts, sesame seeds, sunflower seeds, and flaxseed. Beverages  Aim for six 8-oz glasses of water per day. Limit coffee to three to five 8-oz cups per day.  Limit caffeinated beverages that have added calories, such as soda and energy drinks.  Limit alcohol intake to no more than 1 drink a day for nonpregnant women and 2 drinks a day for men. One drink equals 12 oz of beer (355 mL), 5 oz of wine (148 mL), or 1 oz of hard liquor (44 mL). Seasoning and other foods  Avoid adding excess amounts of salt to your foods. Try flavoring foods with herbs and spices instead of salt.  Avoid adding sugar to foods.  Try using oil-based dressings, sauces, and spreads instead of solid fats. This information is based on general [place] nutrition guidelines. For more information, visit [employer]. Exact amounts may vary based on your nutrition needs. Summary  A healthy eating plan may help you to maintain a healthy weight, reduce the risk of chronic diseases, and stay active throughout your life.  Plan your meals. Make sure you eat the right portions of a variety of nutrient-rich foods.  Try baking, boiling, grilling, or broiling instead of frying.  Choose healthy options in all settings, including home, work, school, restaurants, or stores. This information is not intended to replace advice given to you by your health care provider. Make sure you discuss any questions you have with your health care provider. Document Revised: 10/31/2017 Document  Reviewed: 10/31/2017 Elsevier Patient Education  [place].

## 2019-11-20 NOTE — Patient Instructions (Addendum)
Medication Instructions:  Your physician recommends that you continue on your current medications as directed. Please refer to the Current Medication list given to you today.  A one time dose of Metoprolol 50 mg tablet has been sent to your pharmacy to be taken 2 hours prior to your Cor CTA *If you need a refill on your cardiac medications before your next appointment, please call your pharmacy*   Lab Work: None ordered If you have labs (blood work) drawn today and your tests are completely normal, you will receive your results only by: Marland Kitchen MyChart Message (if you have MyChart) OR . A paper copy in the mail If you have any lab test that is abnormal or we need to change your treatment, we will call you to review the results.   Testing/Procedures: Your physician has requested that you have an echocardiogram. Echocardiography is a painless test that uses sound waves to create images of your heart. It provides your doctor with information about the size and shape of your heart and how well your heart's chambers and valves are working. This procedure takes approximately one hour. There are no restrictions for this procedure.  Your physician has requested that you have cardiac CT. Cardiac computed tomography (CT) is a painless test that uses an x-ray machine to take clear, detailed pictures of your heart. For further information please visit [email or web address] Please follow instruction sheet as given.      Follow-Up: At [facility], you and your health needs are our priority.  As part of our continuing mission to provide you with exceptional heart care, we have created designated Provider Care Teams.  These Care Teams include your primary Cardiologist (physician) and Advanced Practice Providers (APPs -  Physician Assistants and Nurse Practitioners) who all work together to provide you with the care you need, when you need it.  We recommend signing up for the patient portal called "MyChart".   Sign up information is provided on this After Visit Summary.  MyChart is used to connect with patients for Virtual Visits (Telemedicine).  Patients are able to view lab/test results, encounter notes, upcoming appointments, etc.  Non-urgent messages can be sent to your provider as well.   To learn more about what you can do with MyChart, go to [email or web address].    Your next appointment:   4 week(s)  The format for your next appointment:   In Person  Provider:    You may see Dr. [name] or one of the following Advanced Practice Providers on your designated Care Team:    Nicolasa Ducking, NP  Eula Listen, PA-C  Marisue Ivan, PA-C    Other Instructions Your cardiac CT will be scheduled at one of the below locations:   [facility] [address] [place], [place] [address] [phone]  OR  [facility] [address] [place], [place] [address] [phone]  If scheduled at [facility], please arrive at the [facility] main entrance of [facility] 30 minutes prior to test start time. Proceed to the [facility] Radiology Department (first floor) to check-in and test prep.  If scheduled at [facility], please arrive 15 mins early for check-in and test prep.  Please follow these instructions carefully (unless otherwise directed):  Hold all erectile dysfunction medications at least 3 days (72 hrs) prior to test.  On the Night Before the Test: . Be sure to Drink plenty of water. . Do not consume any  caffeinated/decaffeinated beverages or chocolate 12 hours prior to your test. . Do not take any antihistamines 12 hours prior to your test. I On the Day of the Test: . Drink plenty of water. Do not drink any water within one hour of the test. . Do not eat any food 4 hours prior to the test. . You may take your regular medications prior to the test.  . Take metoprolol  (Lopressor) two hours prior to test.      After the Test: . Drink plenty of water. . After receiving IV contrast, you may experience a mild flushed feeling. This is normal. . On occasion, you may experience a mild rash up to 24 hours after the test. This is not dangerous. If this occurs, you can take Benadryl 25 mg and increase your fluid intake. . If you experience trouble breathing, this can be serious. If it is severe call 911 IMMEDIATELY. If it is mild, please call our office. . If you take any of these medications: Glipizide/Metformin, Avandament, Glucavance, please do not take 48 hours after completing test unless otherwise instructed.   Once we have confirmed authorization from your insurance company, we will call you to set up a date and time for your test.   For non-scheduling related questions, please contact the cardiac imaging nurse navigator should you have any questions/concerns: Marchia Bond, RN Navigator Cardiac Imaging [facility] [phone] office  For scheduling needs, including cancellations and rescheduling, please call [phone].     Heart-Healthy Eating Plan Heart-healthy meal planning includes:  Eating less unhealthy fats.  Eating more healthy fats.  Making other changes in your diet. Talk with your doctor or a diet specialist (dietitian) to create an eating plan that is right for you. What are tips for following this plan? Cooking Avoid frying your food. Try to bake, boil, grill, or broil it instead. You can also reduce fat by:  Removing the skin from poultry.  Removing all visible fats from meats.  Steaming vegetables in water or broth. Meal planning   At meals, divide your plate into four equal parts: ? Fill one-half of your plate with vegetables and green salads. ? Fill one-fourth of your plate with whole grains. ? Fill one-fourth of your plate with lean protein foods.  Eat 4-5 servings of vegetables per day. A  serving of vegetables is: ? 1 cup of raw or cooked vegetables. ? 2 cups of raw leafy greens.  Eat 4-5 servings of fruit per day. A serving of fruit is: ? 1 medium whole fruit. ?  cup of dried fruit. ?  cup of fresh, frozen, or canned fruit. ?  cup of 100% fruit juice.  Eat more foods that have soluble fiber. These are apples, broccoli, carrots, beans, peas, and barley. Try to get 20-30 g of fiber per day.  Eat 4-5 servings of nuts, legumes, and seeds per week: ? 1 serving of dried beans or legumes equals  cup after being cooked. ? 1 serving of nuts is  cup. ? 1 serving of seeds equals 1 tablespoon. General information  Eat more home-cooked food. Eat less restaurant, buffet, and fast food.  Limit or avoid alcohol.  Limit foods that are high in starch and sugar.  Avoid fried foods.  Lose weight if you are overweight.  Keep track of how much salt (sodium) you eat. This is important if you have high blood pressure. Ask your doctor to tell you more about this.  Try  to add vegetarian meals each week. Fats  Choose healthy fats. These include olive oil and canola oil, flaxseeds, walnuts, almonds, and seeds.  Eat more omega-3 fats. These include salmon, mackerel, sardines, tuna, flaxseed oil, and ground flaxseeds. Try to eat fish at least 2 times each week.  Check food labels. Avoid foods with trans fats or high amounts of saturated fat.  Limit saturated fats. ? These are often found in animal products, such as meats, butter, and cream. ? These are also found in plant foods, such as palm oil, palm kernel oil, and coconut oil.  Avoid foods with partially hydrogenated oils in them. These have trans fats. Examples are stick margarine, some tub margarines, cookies, crackers, and other baked goods. What foods can I eat? Fruits All fresh, canned (in natural juice), or frozen fruits. Vegetables Fresh or frozen vegetables (raw, steamed, roasted, or grilled). Green  salads. Grains Most grains. Choose whole wheat and whole grains most of the time. Rice and pasta, including brown rice and pastas made with whole wheat. Meats and other proteins Lean, well-trimmed beef, veal, pork, and lamb. Chicken and [place] without skin. All fish and shellfish. Wild duck, rabbit, pheasant, and venison. Egg whites or low-cholesterol egg substitutes. Dried beans, peas, lentils, and tofu. Seeds and most nuts. Dairy Low-fat or nonfat cheeses, including ricotta and mozzarella. Skim or 1% milk that is liquid, powdered, or evaporated. Buttermilk that is made with low-fat milk. Nonfat or low-fat yogurt. Fats and oils Non-hydrogenated (trans-free) margarines. Vegetable oils, including soybean, sesame, sunflower, olive, peanut, safflower, corn, canola, and cottonseed. Salad dressings or mayonnaise made with a vegetable oil. Beverages Mineral water. Coffee and tea. Diet carbonated beverages. Sweets and desserts Sherbet, gelatin, and fruit ice. Small amounts of dark chocolate. Limit all sweets and desserts. Seasonings and condiments All seasonings and condiments. The items listed above may not be a complete list of foods and drinks you can eat. Contact a dietitian for more options. What foods should I avoid? Fruits Canned fruit in heavy syrup. Fruit in cream or butter sauce. Fried fruit. Limit coconut. Vegetables Vegetables cooked in cheese, cream, or butter sauce. Fried vegetables. Grains Breads that are made with saturated or trans fats, oils, or whole milk. Croissants. Sweet rolls. Donuts. High-fat crackers, such as cheese crackers. Meats and other proteins Fatty meats, such as hot dogs, ribs, sausage, bacon, rib-eye roast or steak. High-fat deli meats, such as salami and bologna. Caviar. Domestic duck and goose. Organ meats, such as liver. Dairy Cream, sour cream, cream cheese, and creamed cottage cheese. Whole-milk cheeses. Whole or 2% milk that is liquid, evaporated, or  condensed. Whole buttermilk. Cream sauce or high-fat cheese sauce. Yogurt that is made from whole milk. Fats and oils Meat fat, or shortening. Cocoa butter, hydrogenated oils, palm oil, coconut oil, palm kernel oil. Solid fats and shortenings, including bacon fat, salt pork, lard, and butter. Nondairy cream substitutes. Salad dressings with cheese or sour cream. Beverages Regular sodas and juice drinks with added sugar. Sweets and desserts Frosting. Pudding. Cookies. Cakes. Pies. Milk chocolate or white chocolate. Buttered syrups. Full-fat ice cream or ice cream drinks. The items listed above may not be a complete list of foods and drinks to avoid. Contact a dietitian for more information. Summary  Heart-healthy meal planning includes eating less unhealthy fats, eating more healthy fats, and making other changes in your diet.  Eat a balanced diet. This includes fruits and vegetables, low-fat or nonfat dairy, lean protein, nuts and legumes, whole grains,  and heart-healthy oils and fats. This information is not intended to replace advice given to you by your health care provider. Make sure you discuss any questions you have with your health care provider. Document Revised: 09/22/2017 Document Reviewed: 08/26/2017 Elsevier Patient Education  2020 [employer].

## 2019-11-20 NOTE — Progress Notes (Signed)
Cardiology Office Note   Date:  11/20/2019   ID:  Luis Ray, DOB [birth year], MRN [id number]  PCP:  [name], PA-C  Cardiologist:   Lorine Bears, MD   Chief Complaint  Patient presents with  . New Patient (Initial Visit)    Pt states having tightness in chest/ irregular heart beat. Meds verbally reviewed w/ pt.       History of Present Illness: Luis Ray is a 54 y.o. male who was referred by Osvaldo Angst for evaluation of chest pain and palpitations. He was seen by me in 2016 for chest pain.  He has known history of hyperlipidemia He does have family history of coronary artery disease.  His father had CABG at the age of 63.  Multiple members on his mother side had myocardial infarction in their 60s and 33s.  It was felt that some of his chest pain at that time was related to stress.  He underwent a treadmill stress test which showed no evidence of ischemia.  He was able to exercise for 10 minutes and 17 seconds. He did have some PVCs at that time in recovery. He is not a smoker. He was diagnosed with COVID-19 in November that did not require hospitalization. However, since that time, he has experienced from intermittent substernal chest tightness which has been completely random. This can last 2 to 3 hours at 1 time and then the symptoms  disappear for a week or 2 before he gets another episode. There is no correlation with exercise. Some associated shortness of breath. He is noted to have PVCs but he denies palpitations. No excessive caffeine intake. His life is somewhat stressful but nothing unusual lately.   Past Medical History:  Diagnosis Date  . Alopecia   . History of 2019 novel coronavirus disease (COVID-19) 06/2019  . Insomnia     Past Surgical History:  Procedure Laterality Date  . ROTATOR CUFF REPAIR Right      Current Outpatient Medications  Medication Sig Dispense Refill  . albuterol (VENTOLIN HFA) 108 (90 Base) MCG/ACT inhaler SMARTSIG:2  Puff(s) By Mouth Every 4 Hours PRN    . aspirin EC 81 MG tablet Take 81 mg by mouth daily.    . cetirizine (ZYRTEC) 10 MG tablet Take 10 mg by mouth daily.    . clonazePAM (KLONOPIN) 0.5 MG tablet clonazepam 0.5 mg tablet    . finasteride (PROSCAR) 5 MG tablet TAKE 1/4 TABLET BY MOUTH DAILY AS DIRECTED BY PHYSICIAN. 30 tablet 3  . fluticasone (FLONASE) 50 MCG/ACT nasal spray SPRAY 2 SPRAYS INTO EACH NOSTRIL EVERY DAY 48 mL 0  . metoprolol tartrate (LOPRESSOR) 25 MG tablet Take 0.5 tablets (12.5 mg total) by mouth 2 (two) times daily. 90 tablet 0  . traZODone (DESYREL) 50 MG tablet TAKE 1/2 TO 1 TABLET (25-50 MG TOTAL) BY MOUTH AT BEDTIME AS NEEDED FOR SLEEP. 90 tablet 2  . valACYclovir (VALTREX) 1000 MG tablet Take 1,000 mg by mouth 2 (two) times daily.     No current facility-administered medications for this visit.    Allergies:   Sulfa antibiotics    Social History:  The patient  reports that he has never smoked. He has never used smokeless tobacco. He reports current alcohol use. He reports that he does not use drugs.   Family History:  The patient's family history includes Coronary artery disease in his father.    ROS:  Please see the history of present illness.  Otherwise, review of systems are positive for none.   All other systems are reviewed and negative.    PHYSICAL EXAM: VS:  BP 120/72 (BP Location: Right Arm, Patient Position: Sitting, Cuff Size: Normal)   Pulse 63   Ht 5\' 10"  (1.778 m)   Wt 215 lb 8 oz (97.8 kg)   SpO2 98%   BMI 30.92 kg/m  , BMI Body mass index is 30.92 kg/m. GEN: Well nourished, well developed, in no acute distress  HEENT: normal  Neck: no JVD, carotid bruits, or masses Cardiac: RRR; no murmurs, rubs, or gallops,no edema  Respiratory:  clear to auscultation bilaterally, normal work of breathing GI: soft, nontender, nondistended, + BS MS: no deformity or atrophy  Skin: warm and dry, no rash Neuro:  Strength and sensation are intact Psych:  euthymic mood, full affect   EKG:  EKG is ordered today. The ekg ordered today demonstrates normal sinus rhythm with PVCs.   Recent Labs: 01/04/2019: ALT 16; BUN 18; Creatinine, Ser 1.27; Hemoglobin 16.1; Platelets 249; Potassium 4.8; Sodium 143; TSH 1.520    Lipid Panel    Component Value Date/Time   CHOL 232 (H) 01/04/2019 1125   TRIG 64 01/04/2019 1125   HDL 53 01/04/2019 1125   CHOLHDL 4.4 01/04/2019 1125   LDLCALC 166 (H) 01/04/2019 1125      Wt Readings from Last 3 Encounters:  11/20/19 215 lb 8 oz (97.8 kg)  11/20/19 215 lb (97.5 kg)  07/08/19 214 lb 15.2 oz (97.5 kg)        No flowsheet data found.    ASSESSMENT AND PLAN:  1. Chest pain with some anginal and some atypical features: There is has been present since he was diagnosed with Covid in November. He has strong family history of premature coronary artery disease and has known history of untreated hyperlipidemia. In addition, his EKG is showing frequent PVCs. I discussed with him different diagnostic options and I think the best option is to proceed with cardiac CTA with FFR if needed. He is not allergic to contrast. This will not only tell us about obstructive disease but also about plaque burden given his family history and hyperlipidemia.  2. PVCs: He does not have palpitations. Given associated chest pain and some shortness of breath, I requested an echocardiogram to ensure no structural heart abnormalities or cardiomyopathy.  3. Hyperlipidemia: I discussed with him the importance of heart healthy diet. If CT shows evidence of coronary atherosclerosis, treatment with a statin is indicated.    Disposition:   FU with me in 1 month  Signed,  Kathlyn Sacramento, MD  11/20/2019 2:42 PM    [facility]

## 2019-11-21 LAB — CBC WITH DIFFERENTIAL/PLATELET
Basophils Absolute: 0 10*3/uL (ref 0.0–0.2)
Basos: 1 %
EOS (ABSOLUTE): 0.2 10*3/uL (ref 0.0–0.4)
Eos: 4 %
Hematocrit: 43.2 % (ref 37.5–51.0)
Hemoglobin: 15.2 g/dL (ref 13.0–17.7)
Immature Grans (Abs): 0 10*3/uL (ref 0.0–0.1)
Immature Granulocytes: 0 %
Lymphocytes Absolute: 1.7 10*3/uL (ref 0.7–3.1)
Lymphs: 31 %
MCH: 31 pg (ref 26.6–33.0)
MCHC: 35.2 g/dL (ref 31.5–35.7)
MCV: 88 fL (ref 79–97)
Monocytes Absolute: 0.6 10*3/uL (ref 0.1–0.9)
Monocytes: 12 %
Neutrophils Absolute: 2.7 10*3/uL (ref 1.4–7.0)
Neutrophils: 52 %
Platelets: 238 10*3/uL (ref 150–450)
RBC: 4.91 x10E6/uL (ref 4.14–5.80)
RDW: 12.8 % (ref 11.6–15.4)
WBC: 5.3 10*3/uL (ref 3.4–10.8)

## 2019-11-21 LAB — LIPID PANEL
Chol/HDL Ratio: 3.6 ratio (ref 0.0–5.0)
Cholesterol, Total: 199 mg/dL (ref 100–199)
HDL: 56 mg/dL (ref >39)
LDL Chol Calc (NIH): 131 mg/dL — ABNORMAL HIGH (ref 0–99)
Triglycerides: 64 mg/dL (ref 0–149)
VLDL Cholesterol Cal: 12 mg/dL (ref 5–40)

## 2019-11-21 LAB — TSH: TSH: 1.04 u[IU]/mL (ref 0.450–4.500)

## 2019-11-21 LAB — COMPREHENSIVE METABOLIC PANEL
ALT: 23 IU/L (ref 0–44)
AST: 19 IU/L (ref 0–40)
Albumin/Globulin Ratio: 1.8 (ref 1.2–2.2)
Albumin: 4.4 g/dL (ref 3.8–4.9)
Alkaline Phosphatase: 58 IU/L (ref 39–117)
BUN/Creatinine Ratio: 10 (ref 9–20)
BUN: 12 mg/dL (ref 6–24)
Bilirubin Total: 0.5 mg/dL (ref 0.0–1.2)
CO2: 22 mmol/L (ref 20–29)
Calcium: 9.4 mg/dL (ref 8.7–10.2)
Chloride: 105 mmol/L (ref 96–106)
Creatinine, Ser: 1.19 mg/dL (ref 0.76–1.27)
GFR calc Af Amer: 79 mL/min/{1.73_m2} (ref >59)
GFR calc non Af Amer: 68 mL/min/{1.73_m2} (ref >59)
Globulin, Total: 2.4 g/dL (ref 1.5–4.5)
Glucose: 89 mg/dL (ref 65–99)
Potassium: 4.3 mmol/L (ref 3.5–5.2)
Sodium: 142 mmol/L (ref 134–144)
Total Protein: 6.8 g/dL (ref 6.0–8.5)

## 2019-11-21 LAB — MAGNESIUM: Magnesium: 2.1 mg/dL (ref 1.6–2.3)

## 2019-12-01 ENCOUNTER — Other Ambulatory Visit: Payer: Self-pay | Admitting: Physician Assistant

## 2019-12-01 DIAGNOSIS — F419 Anxiety disorder, unspecified: Secondary | ICD-10-CM

## 2019-12-01 NOTE — Telephone Encounter (Signed)
Requested medication (s) are due for refill today: yes  Requested medication (s) are on the active medication list: no  Last refill:  07/04/19  Future visit scheduled: no  Notes to clinic:  prescription  exp 10/02/19   Requested Prescriptions  Pending Prescriptions Disp Refills   busPIRone (BUSPAR) 7.5 MG tablet [Pharmacy Med Name: BUSPIRONE HCL 7.5 MG TABLET] 180 tablet 0    Sig: Take 1 tablet (7.5 mg total) by mouth 2 (two) times daily.      Psychiatry: Anxiolytics/Hypnotics - Non-controlled Passed - 12/01/2019  9:35 AM      Passed - Valid encounter within last 6 months    Recent Outpatient Visits           1 week ago Chest discomfort   [facility] Osvaldo Angst M, [place]   5 months ago History of 2019 novel coronavirus disease (COVID-19)   [facility] [place], [place], [place]   6 months ago Chronic anxiety   [facility] Osvaldo Angst M, [place]   7 months ago Insomnia, unspecified type   [facility] Osvaldo Angst M, PA-C   9 months ago Annual physical exam   [facility] [name], [place]       Future Appointments             In 2 weeks Iran Ouch, MD [facility], [facility]

## 2019-12-10 ENCOUNTER — Other Ambulatory Visit: Payer: Self-pay

## 2019-12-10 ENCOUNTER — Ambulatory Visit (INDEPENDENT_AMBULATORY_CARE_PROVIDER_SITE_OTHER): Payer: BC Managed Care – PPO

## 2019-12-10 DIAGNOSIS — R072 Precordial pain: Secondary | ICD-10-CM | POA: Diagnosis not present

## 2019-12-17 ENCOUNTER — Telehealth (HOSPITAL_COMMUNITY): Payer: Self-pay | Admitting: *Deleted

## 2019-12-17 NOTE — Telephone Encounter (Signed)
Reaching out to patient to offer assistance regarding upcoming cardiac imaging study; pt verbalizes understanding of appt date/time, parking situation and where to check in, pre-test NPO status and medications ordered, and verified current allergies; name and call back number provided for further questions should they arise  Merle Tai RN Navigator Cardiac Imaging [facility] [phone] office [phone] cell 

## 2019-12-18 ENCOUNTER — Ambulatory Visit (HOSPITAL_COMMUNITY)
Admission: RE | Admit: 2019-12-18 | Discharge: 2019-12-18 | Disposition: A | Discharge status: Home / Self Care | Payer: BC Managed Care – PPO | Source: Ambulatory Visit | Attending: Cardiovascular Disease | Admitting: Cardiovascular Disease

## 2019-12-18 DIAGNOSIS — R072 Precordial pain: Secondary | ICD-10-CM | POA: Diagnosis not present

## 2019-12-18 IMAGING — CT CT HEART MORP W/ CTA COR W/ SCORE W/ CA W/CM &/OR W/O CM
4 of 7 series · 8 of 20 positions shown, 9 images · IV contrast (APPLIED)
Comparison: 07/08/2019 chest radiograph.  No prior CT.
COMPARISON: 07/08/2019 chest radiograph.  No prior CT.

Addendum:
EXAM:
OVER-READ INTERPRETATION  CT CHEST

The following report is an over-read performed by radiologist Dr.
Hhiggh Saittis [[email or web address]] on 12/18/2019. This over-read
does not include interpretation of cardiac or coronary anatomy or
pathology. The coronary CTA interpretation by the cardiologist is
attached.
HISTORY: 54 yo male with chest pain, AAS suspected, hemodynamically stable
Cardiac/Coronary CTA
TECHNIQUE: The patient was scanned on a Siemens Force scanner.
PROTOCOL: A 120 kV prospective scan was triggered in the descending thoracic
aorta at 111 HU's. Axial non-contrast 3 mm slices were carried out
through the heart. The data set was analyzed on a dedicated work
station and scored using the Agatson method. Gantry rotation speed
was 250 msecs and collimation was .6 mm. Beta blockade and 0.8 mg of
sl NTG was given. The 3D data set was reconstructed in 5% intervals
of the 67-82 % of the R-R cycle. Diastolic phases were analyzed on a
dedicated work station using MPR, MIP and VRT modes. The patient
received 80mL OMNIPAQUE IOHEXOL 350 MG/ML SOLN of contrast.

[Series 6: best diast 74 % · axial · 0.39mm/px · z∈[-232,-189]mm · 2 of 328 slices shown]
[im 110/328  vessel]
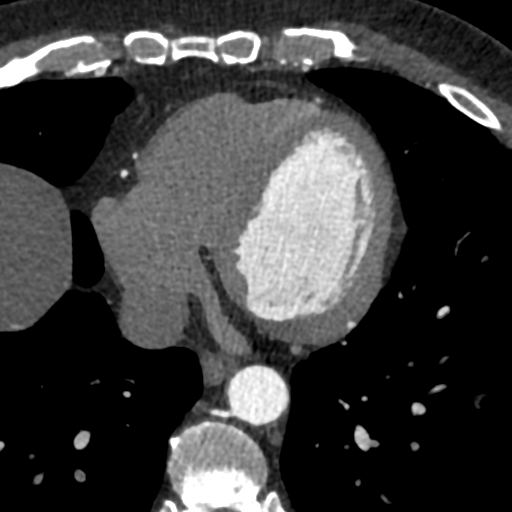
[im 219/328  vessel]
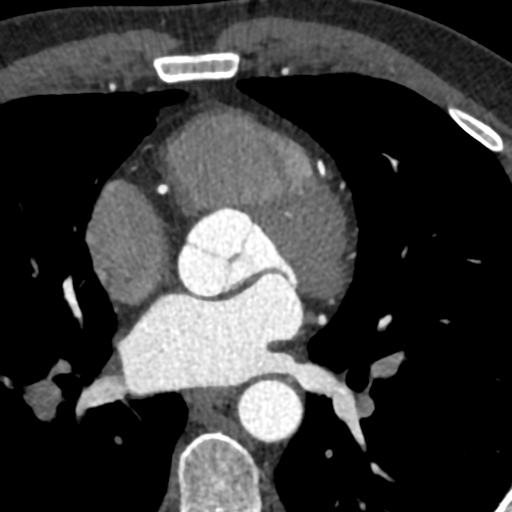

[Series 7: best syst · axial · 0.39mm/px · z∈[-232,-189]mm · 2 of 328 slices shown, 3 images]
[im 110/328  vessel]
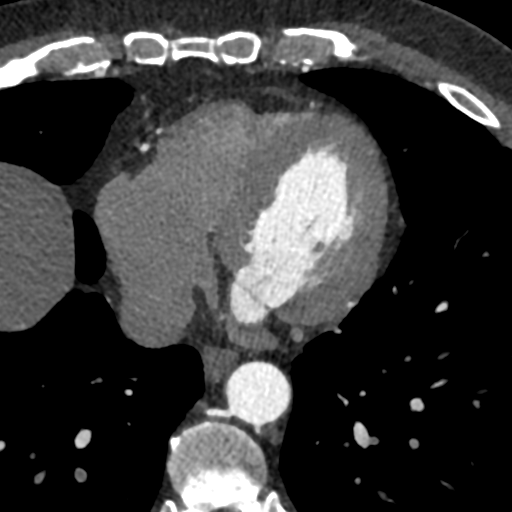
[im 110/328  lung]
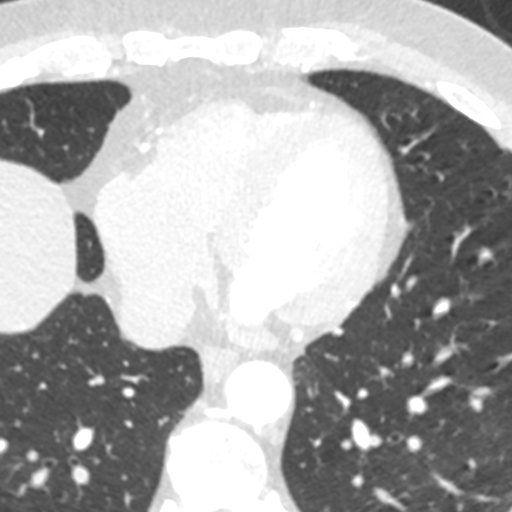
[im 219/328  vessel]
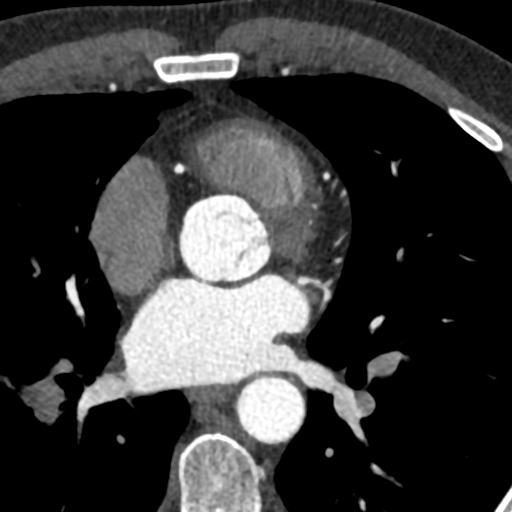

[Series 9: ts diast sharp · axial · 0.39mm/px · z∈[-232,-189]mm · 2 of 328 slices shown]
[im 110/328  lung]
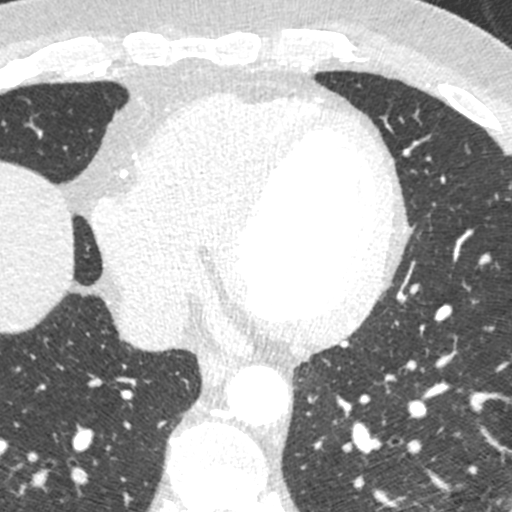
[im 219/328  lung]
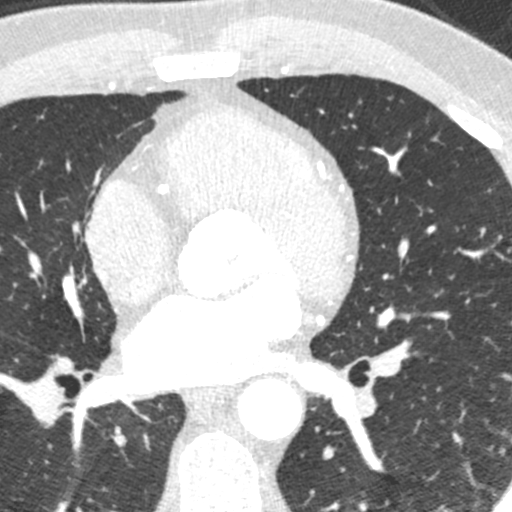

[Series 10: ts syst sharp · axial · 0.39mm/px · z∈[-232,-189]mm · 2 of 328 slices shown]
[im 110/328  lung]
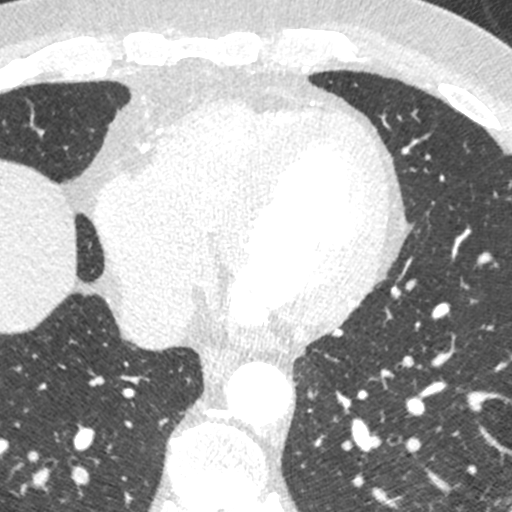
[im 219/328  lung]
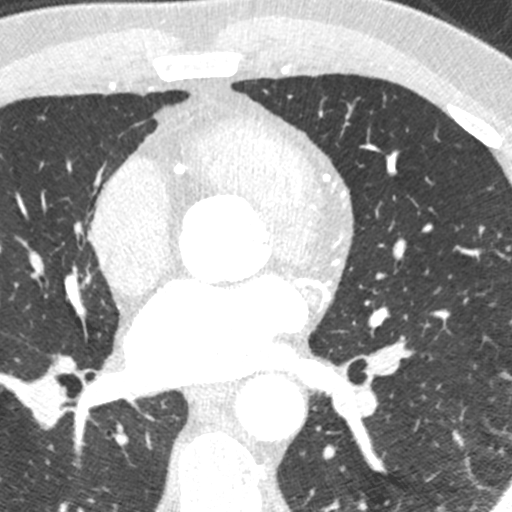

[8 of 20 positions shown; findings below may reference images not displayed]

FINDINGS: Vascular: Normal aortic caliber. No central pulmonary embolism, on
this non-dedicated study.

Mediastinum/Nodes: No imaged thoracic adenopathy.

Lungs/Pleura: No pleural fluid.  Clear imaged lungs.

Upper Abdomen: Mild hepatic steatosis.

Musculoskeletal: No acute osseous abnormality.
IMPRESSION: 1.  No acute findings in the imaged extracardiac chest.
2. Hepatic steatosis.
FINDINGS: Quality: Excellent (HR 56)

Coronary calcium score: The patient's coronary artery calcium score
is 86, which places the patient in the 77th percentile.

Coronary arteries: Normal coronary origins.  Right dominance.

Right Coronary Artery: Dominant vessel which gives rise to R-PDA and
R-PLB branches. Punctate area of proximal calcification without
significant stenosis, otherwise normal.

Left Main Coronary Artery: Long vessel. Minimal punctate distal
calcification without significant stenosis. Bifurcates into the LAD
and LCx arteries. There are 2 moderate diagonal branches.

Left Anterior Descending Coronary Artery: Extends around the apex.
There is mild 25-49% mixed proximal stenosis (PUFOUF7T) after the
high D2 vessel. The distal LAD is free of disease. The D1 branch is
normal. The D2 branch has a mild 1-24% mixed proximal stenosis
(ASASSARL), however, this vessel is <2.0 mm.

Left Circumflex Artery: AV groove vessel. Minimal mid-vessel
punctate calcification without stenosis. There is a small mid-vessel
OM branch without stenosis.

Aorta: Normal size, 26 mm at the mid ascending aorta (level of the
PA bifurcation) measured double oblique. No calcifications. No
dissection.

Aortic Valve: Trileaflet.  No calcifications.

Other findings:

Normal pulmonary vein drainage into the left atrium.

Normal left atrial appendage without a thrombus.

Normal size of the pulmonary artery.
IMPRESSION: 1. Mild mixed non-obstructive CAD of the proximal LAD, CADRADS = 2.

2. Coronary calcium score of 86. This was 77th percentile for age
and sex matched control. Calcium is predominantly in the LAD.

3. Normal coronary origin with right dominance.

*** End of Addendum ***
EXAM:
OVER-READ INTERPRETATION  CT CHEST

The following report is an over-read performed by radiologist Dr.
Hhiggh Saittis [REDACTED] on 12/18/2019. This over-read
does not include interpretation of cardiac or coronary anatomy or
pathology. The coronary CTA interpretation by the cardiologist is
attached.
FINDINGS: Vascular: Normal aortic caliber. No central pulmonary embolism, on
this non-dedicated study.

Mediastinum/Nodes: No imaged thoracic adenopathy.

Lungs/Pleura: No pleural fluid.  Clear imaged lungs.

Upper Abdomen: Mild hepatic steatosis.

Musculoskeletal: No acute osseous abnormality.
IMPRESSION: 1.  No acute findings in the imaged extracardiac chest.
2. Hepatic steatosis.

## 2019-12-18 MED ORDER — IOHEXOL 350 MG/ML SOLN
80.0000 mL | Freq: Once | INTRAVENOUS | Status: AC | PRN
  Administered 2019-12-18: 80 mL via INTRAVENOUS

## 2019-12-18 MED ORDER — NITROGLYCERIN 0.4 MG SL SUBL
SUBLINGUAL_TABLET | SUBLINGUAL | Status: AC
  Filled 2019-12-18: qty 2

## 2019-12-20 ENCOUNTER — Ambulatory Visit (INDEPENDENT_AMBULATORY_CARE_PROVIDER_SITE_OTHER): Payer: BC Managed Care – PPO | Admitting: Cardiovascular Disease

## 2019-12-20 ENCOUNTER — Other Ambulatory Visit: Payer: Self-pay

## 2019-12-20 ENCOUNTER — Encounter: Payer: Self-pay | Admitting: Cardiovascular Disease

## 2019-12-20 VITALS — BP 120/80 | HR 80 | Ht 70.0 in | Wt 213.2 lb

## 2019-12-20 DIAGNOSIS — I493 Ventricular premature depolarization: Secondary | ICD-10-CM

## 2019-12-20 DIAGNOSIS — E785 Hyperlipidemia, unspecified: Secondary | ICD-10-CM | POA: Diagnosis not present

## 2019-12-20 DIAGNOSIS — R072 Precordial pain: Secondary | ICD-10-CM | POA: Diagnosis not present

## 2019-12-20 MED ORDER — DILTIAZEM HCL ER COATED BEADS 120 MG PO CP24
120.0000 mg | ORAL_CAPSULE | Freq: Every day | ORAL | 6 refills | Status: DC
Start: 2019-12-20 — End: 2020-07-10

## 2019-12-20 MED ORDER — ROSUVASTATIN CALCIUM 10 MG PO TABS
10.0000 mg | ORAL_TABLET | Freq: Every day | ORAL | 6 refills | Status: DC
Start: 2019-12-20 — End: 2020-07-10

## 2019-12-20 NOTE — Progress Notes (Signed)
Cardiology Office Note   Date:  12/20/2019   ID:  Luis Ray, DOB [birth year], MRN [id number]  PCP:  Trinna Post, PA-C  Cardiologist:   Kathlyn Sacramento, MD   Chief Complaint  Patient presents with  . OTHER    F/u echo/CT no complaints today. Meds reviewed verbally with pt.      History of Present Illness: Luis Ray is a 54 y.o. male who is here today for follow-up visit regarding chest pain and palpitations.   He was seen by me in 2016 for chest pain.  He has known history of hyperlipidemia He does have family history of coronary artery disease.  His father had CABG at the age of 44.  Multiple members on his mother side had myocardial infarction in their 12s and 58s.  It was felt that some of his chest pain at that time was related to stress.  He was evaluated at that time with a treadmill stress test  which showed no evidence of ischemia.  He was able to exercise for 10 minutes and 17 seconds. He did have some PVCs at that time in recovery. He is not a smoker. He was diagnosed with COVID-19 in November that did not require hospitalization. However, since that time, he had intermittent episodes of substernal chest tightness.  He was also noted to have PVCs.  He underwent CTA of the coronary arteries which showed mild nonobstructive coronary artery disease.  Calcium score was 86.  His echocardiogram showed normal LV systolic function with no significant valvular abnormalities and no evidence of pulmonary hypertension. He has been doing reasonably well although he continues to have substernal chest pain at rest mostly in the afternoon.  No palpitations or dizziness.  Past Medical History:  Diagnosis Date  . Alopecia   . History of 2019 novel coronavirus disease (COVID-19) 06/2019  . Insomnia     Past Surgical History:  Procedure Laterality Date  . ROTATOR CUFF REPAIR Right      Current Outpatient Medications  Medication Sig Dispense Refill  . albuterol (VENTOLIN HFA)  108 (90 Base) MCG/ACT inhaler SMARTSIG:2 Puff(s) By Mouth Every 4 Hours PRN    . aspirin EC 81 MG tablet Take 81 mg by mouth daily.    . busPIRone (BUSPAR) 7.5 MG tablet TAKE 1 TABLET (7.5 MG TOTAL) BY MOUTH 2 (TWO) TIMES DAILY. 180 tablet 0  . cetirizine (ZYRTEC) 10 MG tablet Take 10 mg by mouth daily.    . clonazePAM (KLONOPIN) 0.5 MG tablet clonazepam 0.5 mg tablet    . finasteride (PROSCAR) 5 MG tablet TAKE 1/4 TABLET BY MOUTH DAILY AS DIRECTED BY PHYSICIAN. 30 tablet 3  . fluticasone (FLONASE) 50 MCG/ACT nasal spray SPRAY 2 SPRAYS INTO EACH NOSTRIL EVERY DAY 48 mL 0  . metoprolol tartrate (LOPRESSOR) 25 MG tablet Take 0.5 tablets (12.5 mg total) by mouth 2 (two) times daily. 90 tablet 0  . traZODone (DESYREL) 50 MG tablet TAKE 1/2 TO 1 TABLET (25-50 MG TOTAL) BY MOUTH AT BEDTIME AS NEEDED FOR SLEEP. 90 tablet 2  . valACYclovir (VALTREX) 1000 MG tablet Take 1,000 mg by mouth 2 (two) times daily.     No current facility-administered medications for this visit.    Allergies:   Sulfa antibiotics    Social History:  The patient  reports that he has never smoked. He has never used smokeless tobacco. He reports current alcohol use. He reports that he does not use drugs.   Family  History:  The patient's family history includes Coronary artery disease in his father.    ROS:  Please see the history of present illness.   Otherwise, review of systems are positive for none.   All other systems are reviewed and negative.    PHYSICAL EXAM: VS:  BP 120/80 (BP Location: Left Arm, Patient Position: Sitting, Cuff Size: Normal)   Pulse 80   Ht 5\' 10"  (1.778 m)   Wt 213 lb 4 oz (96.7 kg)   SpO2 98%   BMI 30.60 kg/m  , BMI Body mass index is 30.6 kg/m. GEN: Well nourished, well developed, in no acute distress  HEENT: normal  Neck: no JVD, carotid bruits, or masses Cardiac: RRR with premature beats; no murmurs, rubs, or gallops,no edema  Respiratory:  clear to auscultation bilaterally, normal  work of breathing GI: soft, nontender, nondistended, + BS MS: no deformity or atrophy  Skin: warm and dry, no rash Neuro:  Strength and sensation are intact Psych: euthymic mood, full affect   EKG:  EKG is ordered today. The ekg ordered today demonstrates normal sinus rhythm with frequent PVCs.   Recent Labs: 11/20/2019: ALT 23; BUN 12; Creatinine, Ser 1.19; Hemoglobin 15.2; Magnesium 2.1; Platelets 238; Potassium 4.3; Sodium 142; TSH 1.040    Lipid Panel    Component Value Date/Time   CHOL 199 11/20/2019 1011   TRIG 64 11/20/2019 1011   HDL 56 11/20/2019 1011   CHOLHDL 3.6 11/20/2019 1011   LDLCALC 131 (H) 11/20/2019 1011      Wt Readings from Last 3 Encounters:  12/20/19 213 lb 4 oz (96.7 kg)  11/20/19 215 lb 8 oz (97.8 kg)  11/20/19 215 lb (97.5 kg)        No flowsheet data found.    ASSESSMENT AND PLAN:  1.  Chest pain: Cardiac CTA showed no evidence of obstructive coronary artery disease which is very good news.  It did show mild atherosclerosis and we have to treat his risk factors.  Continue low-dose aspirin.  I wonder if his chest pain is related to frequent PVCs.  2.  Symptomatic PVCs: Echocardiogram showed no evidence of cardiomyopathy or structural heart abnormalities.  Nonetheless, he continues to have frequent PVCs on his EKG and it does not seem like he responded to small dose metoprolol.  The PVCs seem to be monomorphic with right bundle branch block morphology and inferior axis suggestive of LVOT origin.  This might respond better to a calcium channel blocker and thus I elected to switch him from metoprolol to diltiazem extended release 120 mg once daily.  If no improvement in this, I will consider EP referral.  3. Hyperlipidemia: Given the presence of atherosclerosis on cardiac CTA and recent LDL of 131, I am going to start him on rosuvastatin 10 mg daily.  Repeat lipid and liver profile in 6 to 8 weeks.    Disposition:   FU with me in 6  months  Signed,  11/22/19, MD  12/20/2019 1:57 PM     [facility]

## 2019-12-20 NOTE — Patient Instructions (Signed)
Medication Instructions:  Your physician has recommended you make the following change in your medication:   1) START Rosuvastatin (Crestor) 10 mg daily. An Rx has been sent to your pharmacy.  2) STOP Metoprolol  3) START Diltiazem ER 120 mg daily. An Rx has been sent to your pharmacy. *If you need a refill on your cardiac medications before your next appointment, please call your pharmacy*   Lab Work: Your physician recommends that you return for a FASTING lipid profile and hepatic panel in 6-8 weeks.  Please have your labs drawn at the [facility] medical mall. You do not need an appt. Lab hours are Mon-Fri 7am-6pm.  If you have labs (blood work) drawn today and your tests are completely normal, you will receive your results only by: Marland Kitchen MyChart Message (if you have MyChart) OR . A paper copy in the mail If you have any lab test that is abnormal or we need to change your treatment, we will call you to review the results.   Testing/Procedures: None ordered   Follow-Up: At [facility], you and your health needs are our priority.  As part of our continuing mission to provide you with exceptional heart care, we have created designated Provider Care Teams.  These Care Teams include your primary Cardiologist (physician) and Advanced Practice Providers (APPs -  Physician Assistants and Nurse Practitioners) who all work together to provide you with the care you need, when you need it.  We recommend signing up for the patient portal called "MyChart".  Sign up information is provided on this After Visit Summary.  MyChart is used to connect with patients for Virtual Visits (Telemedicine).  Patients are able to view lab/test results, encounter notes, upcoming appointments, etc.  Non-urgent messages can be sent to your provider as well.   To learn more about what you can do with MyChart, go to [email or web address].    Your next appointment:   6 month(s)  The format for your next appointment:    In Person  Provider:    You may see Dr. [name] or one of the following Advanced Practice Providers on your designated Care Team:    Nicolasa Ducking, NP  Eula Listen, PA-C  Marisue Ivan, PA-C    Other Instructions N/A

## 2019-12-26 ENCOUNTER — Other Ambulatory Visit: Payer: Self-pay

## 2019-12-26 ENCOUNTER — Telehealth: Payer: Self-pay | Admitting: Medical

## 2019-12-26 ENCOUNTER — Encounter: Payer: Self-pay | Admitting: Medical

## 2019-12-26 DIAGNOSIS — J0100 Acute maxillary sinusitis, unspecified: Secondary | ICD-10-CM

## 2019-12-26 MED ORDER — AMOXICILLIN-POT CLAVULANATE 875-125 MG PO TABS: 1.0000 | ORAL_TABLET | Freq: Two times a day (BID) | ORAL | 0 refills | Status: DC
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
## 2019-12-27 ENCOUNTER — Other Ambulatory Visit: Payer: Self-pay | Admitting: Medical

## 2019-12-27 DIAGNOSIS — J0100 Acute maxillary sinusitis, unspecified: Secondary | ICD-10-CM

## 2019-12-27 MED ORDER — AMOXICILLIN-POT CLAVULANATE 875-125 MG PO TABS: 1.0000 | ORAL_TABLET | Freq: Two times a day (BID) | ORAL | 0 refills | Status: DC

## 2019-12-27 NOTE — Progress Notes (Signed)
Patient call me. I accidentally wrote for  10 pills the patient requires 20 pills to treat a Sinusitis with Augmentin. I have sent prescription of  10 more pills to his pharmacy.  Meds ordered this encounter  Medications  . amoxicillin-clavulanate (AUGMENTIN) 875-125 MG tablet    Sig: Take 1 tablet by mouth 2 (two) times daily. With food    Dispense:  10 tablet    Refill:  0    I wrote prescription for 10 pills and it should be for  20 pills. [phone]   Initially his wife called but there is no current HIPPA release form on file. Reviewed with patient that he can sign release next visit if he  wants me to talk to her.   I also reviewed with patient if he is not improving he needs to get a Covid-19 test. He states he already feels better since starting the antibiotic.  He verbalizes understanding and has no questions at the end of our conversation.

## 2020-01-01 ENCOUNTER — Other Ambulatory Visit: Payer: Self-pay | Admitting: Physician Assistant

## 2020-01-01 DIAGNOSIS — F419 Anxiety disorder, unspecified: Secondary | ICD-10-CM

## 2020-01-01 MED ORDER — BUSPIRONE HCL 7.5 MG PO TABS: 7.5000 mg | ORAL_TABLET | Freq: Two times a day (BID) | ORAL | 0 refills | Status: DC

## 2020-01-01 NOTE — Telephone Encounter (Signed)
Medication Refill - Medication:  busPIRone (BUSPAR) 7.5 MG tablet [[id number]]    Preferred Pharmacy (with phone number or street name):  [facility] [address], [place] - [address]  [address] [place] [address]  Phone: [phone] Fax: [phone]     Agent: Please be advised that RX refills may take up to 3 business days. We ask that you follow-up with your pharmacy.

## 2020-01-01 NOTE — Telephone Encounter (Signed)
Requested medication (s) are due for refill today -yes  Requested medication (s) are on the active medication list -yes  Future visit scheduled -yes  Last refill: 1 month ago  Notes to clinic: Patient instructed to change dosing at last appointment- running out early- needs change in # and directions to reflect current dose  Requested Prescriptions  Pending Prescriptions Disp Refills   busPIRone (BUSPAR) 7.5 MG tablet 180 tablet 0    Sig: Take 1 tablet (7.5 mg total) by mouth 2 (two) times daily.      Psychiatry: Anxiolytics/Hypnotics - Non-controlled Passed - 01/01/2020  4:08 PM      Passed - Valid encounter within last 6 months    Recent Outpatient Visits           1 month ago Chest discomfort   [facility] [place]do [place]st [place], [place]   6 months ago History of 2019 novel coronavirus disease (COVID-19)   [facility] [place], [place], [place]   7 months ago Chronic anxiety   [facility] Os[place]do Angst [place], [place]   8 months ago Insomnia, unspecified type   [facility] Os[place]do Angst M, PA-C   10 months ago Annual physical exam   [facility] [place]do [place], [place]       Future Appointments             In 6 months [name], Chelsea Aus, MD [facility] [place], [facility]                Requested Prescriptions  Pending Prescriptions Disp Refills   busPIRone (BUSPAR) 7.5 MG tablet 180 tablet 0    Sig: Take 1 tablet (7.5 mg total) by mouth 2 (two) times daily.      Psychiatry: Anxiolytics/Hypnotics - Non-controlled Passed - 01/01/2020  4:08 PM      Passed - Valid encounter within last 6 months    Recent Outpatient Visits           1 month ago Chest discomfort   [facility] Os[place]do Angst M, [place]   6 months ago History of 2019 novel coronavirus disease (COVID-19)   [facility] [place], [place], [place]   7 months ago Chronic anxiety   [facility]  Osvaldo Angst M, [place]   8 months ago Insomnia, unspecified type   [facility] Osvaldo Angst M, PA-C   10 months ago Annual physical exam   [facility] [name], [place]       Future Appointments             In 6 months [name], Chelsea Aus, MD [facility], [facility]

## 2020-01-10 ENCOUNTER — Telehealth: Payer: Self-pay

## 2020-01-10 NOTE — Telephone Encounter (Signed)
Patient states he has started back going to the gym but has not been able to lift weights and seen decrease in strength. Patient states that he needs some growth hormones. Patient declined scheduling appointment and states that he has a CPE in August. Please advise.

## 2020-01-10 NOTE — Telephone Encounter (Signed)
Copied from CRM [id number]. Topic: General - Inquiry >> Jan 10, 2020 11:27 AM Luis Ray wrote: Reason for CRM: pt would like adrianna to call him when she gets time.  Asked pt may I tell her what it is concerning?, he said "my health." pt declined to give any other additional information, and I did not press him any further.  He said she knows I am high maintenance. >> Jan 10, 2020 11:43 AM Luis Ray wrote: I did suggest he could do a virtual visit, but he did not seem interested in that type of visit.

## 2020-01-10 NOTE — Telephone Encounter (Signed)
We don't give growth hormones.

## 2020-01-15 NOTE — Telephone Encounter (Signed)
Usually only pediatric endocrinologist uses human growth hormone treatment for short stature/retarded growth in children. Would recommend scheduling an appointment to evaluate situation. If just trying to build muscle and strength, reducing percentage of body fat and high intensity strength exercise can help improve your own natural human growth hormone effect. Supplementation usually increases risk of other health issues in adults.

## 2020-01-15 NOTE — Telephone Encounter (Signed)
Patient was advised. Patient would like to know if he could be refused out to somewhere that does growth hormones or clinics he could reach out to. Adriana's patient please advise.

## 2020-01-21 NOTE — Telephone Encounter (Signed)
Patient was advise and states that he will continue working out at the gym.

## 2020-02-04 ENCOUNTER — Ambulatory Visit: Payer: BC Managed Care – PPO

## 2020-02-11 ENCOUNTER — Other Ambulatory Visit: Payer: Self-pay | Admitting: Physician Assistant

## 2020-02-11 DIAGNOSIS — R0789 Other chest pain: Secondary | ICD-10-CM

## 2020-02-11 DIAGNOSIS — R002 Palpitations: Secondary | ICD-10-CM

## 2020-02-18 ENCOUNTER — Ambulatory Visit (INDEPENDENT_AMBULATORY_CARE_PROVIDER_SITE_OTHER): Payer: Self-pay

## 2020-02-18 ENCOUNTER — Other Ambulatory Visit: Payer: Self-pay

## 2020-02-18 DIAGNOSIS — Z872 Personal history of diseases of the skin and subcutaneous tissue: Secondary | ICD-10-CM

## 2020-02-18 DIAGNOSIS — L578 Other skin changes due to chronic exposure to nonionizing radiation: Secondary | ICD-10-CM

## 2020-02-18 DIAGNOSIS — I781 Nevus, non-neoplastic: Secondary | ICD-10-CM

## 2020-02-18 NOTE — Progress Notes (Signed)
Discussed BBL. Risks and benefits were reviewed. He doesn't have a history of fever blisters. Recommended 1-3 treatments spaced by 4 weeks apart beginning in the fall when he doesn't have an active tan. Luis Ray

## 2020-02-20 ENCOUNTER — Other Ambulatory Visit: Payer: Self-pay | Admitting: Physician Assistant

## 2020-02-20 ENCOUNTER — Telehealth: Payer: Self-pay | Admitting: Physician Assistant

## 2020-02-20 DIAGNOSIS — F419 Anxiety disorder, unspecified: Secondary | ICD-10-CM

## 2020-02-20 DIAGNOSIS — G4700 Insomnia, unspecified: Secondary | ICD-10-CM

## 2020-02-20 MED ORDER — BUSPIRONE HCL 7.5 MG PO TABS: 7.5000 mg | ORAL_TABLET | Freq: Two times a day (BID) | ORAL | 0 refills | Status: DC

## 2020-02-20 MED ORDER — BUSPIRONE HCL 15 MG PO TABS: 15.0000 mg | ORAL_TABLET | Freq: Two times a day (BID) | ORAL | 1 refills | Status: DC

## 2020-02-20 NOTE — Telephone Encounter (Signed)
Requested medication (s) are due for refill today: yes  Requested medication (s) are on the active medication list: yes  Last refill:  01/01/2020  Future visit scheduled: yes  Notes to clinic:  verify dose and direction Patient states that he is taking 2 tabs bid    Requested Prescriptions  Pending Prescriptions Disp Refills   busPIRone (BUSPAR) 7.5 MG tablet 180 tablet 0    Sig: Take 1 tablet (7.5 mg total) by mouth 2 (two) times daily.      Psychiatry: Anxiolytics/Hypnotics - Non-controlled Passed - 02/20/2020  8:13 AM      Passed - Valid encounter within last 6 months    Recent Outpatient Visits           3 months ago Chest discomfort   [facility] [place]do Angst [place], [place]   7 months ago History of 2019 novel coronavirus disease (COVID-19)   [facility] [place], [place], [place]   9 months ago Chronic anxiety   [facility] Osvaldo Angst M, [place]   10 months ago Insomnia, unspecified type   [facility] Os[place]do Angst M, [place]   11 months ago Annual physical exam   [facility] [place], [place], [place]       Future Appointments             In 2 weeks [name], PA-C [facility], [facility]   In 4 months [name], Chelsea Aus, MD [facility], [facility]

## 2020-02-20 NOTE — Addendum Note (Signed)
Addended by: [name] on: 02/20/2020 03:05 PM   Modules accepted: Orders

## 2020-02-20 NOTE — Telephone Encounter (Signed)
Forgive, I thought it was twice daily that was the issue. No, 15 mg twice a day is fine. Rx sent.

## 2020-02-20 NOTE — Telephone Encounter (Signed)
Luis Ray, patient called back again.  I don't know if you saw the original message.  He states he has been taking 2 BID and the pharmacy has 1 BID.  I see you refilled but for the same, do you want the patient to be told to decrease what he is taking?

## 2020-02-20 NOTE — Telephone Encounter (Signed)
Requested Prescriptions  Pending Prescriptions Disp Refills  . traZODone (DESYREL) 50 MG tablet [Pharmacy Med Name: TRAZODONE 50 MG TABLET] 90 tablet 0    Sig: TAKE 1/2 TO 1 TABLET (25-50 MG TOTAL) BY MOUTH AT BEDTIME AS NEEDED FOR SLEEP.     Psychiatry: Antidepressants - Serotonin Modulator Passed - 02/20/2020  1:23 AM      Passed - Valid encounter within last 6 months    Recent Outpatient Visits          3 months ago Chest discomfort   [facility] Os[place]do Angst [place], [place]   7 months ago History of 2019 novel coronavirus disease (COVID-19)   [facility] [place], [place], [place]   9 months ago Chronic anxiety   [facility] Osvaldo Angst M, [place]   10 months ago Insomnia, unspecified type   [facility] Osvaldo Angst M, [place]   11 months ago Annual physical exam   [facility] [place], [place], [place]      Future Appointments            In 2 weeks [name], PA-C [facility], [facility]   In 4 months [name], Chelsea Aus, MD [facility], [facility]

## 2020-02-20 NOTE — Telephone Encounter (Signed)
Please review for dosage clarification

## 2020-02-20 NOTE — Telephone Encounter (Signed)
Patient requesting busPIRone (BUSPAR) 7.5 MG tablet refill, patient has been taking 2 in the am and 2 in the pm, patient now is out and would like new script reflecting the frequency change, please advise  [id number] [place], [place] - [address] Phone:  [phone]  Fax:  [phone]

## 2020-03-03 NOTE — Progress Notes (Signed)
Inetta Fermo Cummings,acting as a Neurosurgeon for [name], PA-C.,have documented all relevant documentation on the behalf of [name], PA-C,as directed by  [name], PA-C while in the presence of [name], PA-C.  Complete physical exam   Patient: Luis Ray   DOB: [birth year]   54 y.o. Male  MRN: [id number] Visit Date: 03/05/2020  Today's healthcare provider: [name], PA-C   Chief Complaint  Patient presents with  . Annual Exam   Subjective    Luis Ray is a 53 y.o. male who presents today for a complete physical exam.  He reports consuming a low fat and high protein diet. Gym/ health club routine includes mod to heavy weightlifting. He generally feels well. He reports sleeping well. He does not have additional problems to discuss today.   HPI   Patient takes omeprazole daily, says that it helps and he does not feel pressure in chest at night.   Anxiety, Follow-up  He was last seen for anxiety 1 years ago. Changes made at last visit include continue buspar 15 mg bID.   He reports excellent compliance with treatment. He reports excellent tolerance of treatment. He is not having side effects.   He feels his anxiety is moderate and Improved since last visit.  Symptoms: No chest pain No difficulty concentrating  No dizziness No fatigue  No feelings of losing control No insomnia  No irritable No palpitations  No panic attacks No racing thoughts  No shortness of breath No sweating  No tremors/shakes    GAD-7 Results No flowsheet data found.  PHQ-9 Scores PHQ9 SCORE ONLY 03/05/2020 03/05/2019  PHQ-9 Total Score 0 0   Insomnia  Patient taking trazodone PRN for insomnia and continues to do so with relief.   ---------------------------------------------------------------------------------------------------   Past Medical History:  Diagnosis Date  . Alopecia   . History of 2019 novel coronavirus disease (COVID-19) 06/2019  .  Insomnia    Past Surgical History:  Procedure Laterality Date  . ROTATOR CUFF REPAIR Right    Social History   Socioeconomic History  . Marital status: Married    Spouse name: Not on file  . Number of children: Not on file  . Years of education: Not on file  . Highest education level: Not on file  Occupational History  . Not on file  Tobacco Use  . Smoking status: Never Smoker  . Smokeless tobacco: Never Used  Vaping Use  . Vaping Use: Never used  Substance and Sexual Activity  . Alcohol use: Yes    Alcohol/week: 0.0 standard drinks    Comment: rare  . Drug use: No  . Sexual activity: Not on file  Other Topics Concern  . Not on file  Social History Narrative  . Not on file   Social Determinants of Health   Financial Resource Strain:   . Difficulty of Paying Living Expenses:   Food Insecurity:   . Worried About Programme researcher, broadcasting/film/video in the Last Year:   . Barista in the Last Year:   Transportation Needs:   . Freight forwarder (Medical):   Marland Kitchen Lack of Transportation (Non-Medical):   Physical Activity:   . Days of Exercise per Week:   . Minutes of Exercise per Session:   Stress:   . Feeling of Stress :   Social Connections:   . Frequency of Communication with Friends and Family:   . Frequency of Social Gatherings  with Friends and Family:   . Attends Religious Services:   . Active Member of Clubs or Organizations:   . Attends Banker Meetings:   Marland Kitchen Marital Status:   Intimate Partner Violence:   . Fear of Current or Ex-Partner:   . Emotionally Abused:   Marland Kitchen Physically Abused:   . Sexually Abused:    Family Status  Relation Name Status  . Mother  Alive  . Father  Alive  . Mat Uncle  Deceased       heart attack  . MGF  Deceased       heart attack   Family History  Problem Relation Age of Onset  . Coronary artery disease Father    Allergies  Allergen Reactions  . Sulfa Antibiotics Other (See Comments)    Upset stomach    Patient  Care Team: Maryella Shivers as PCP - General (Physician Assistant)   Medications: Outpatient Medications Prior to Visit  Medication Sig  . albuterol (VENTOLIN HFA) 108 (90 Base) MCG/ACT inhaler SMARTSIG:2 Puff(s) By Mouth Every 4 Hours PRN  . amoxicillin-clavulanate (AUGMENTIN) 875-125 MG tablet Take 1 tablet by mouth 2 (two) times daily. With food  . aspirin EC 81 MG tablet Take 81 mg by mouth daily.  . busPIRone (BUSPAR) 15 MG tablet Take 1 tablet (15 mg total) by mouth 2 (two) times daily.  . cetirizine (ZYRTEC) 10 MG tablet Take 10 mg by mouth daily.  . clonazePAM (KLONOPIN) 0.5 MG tablet clonazepam 0.5 mg tablet  . diltiazem (CARDIZEM CD) 120 MG 24 hr capsule Take 1 capsule (120 mg total) by mouth daily.  . finasteride (PROSCAR) 5 MG tablet TAKE 1/4 TABLET BY MOUTH DAILY AS DIRECTED BY PHYSICIAN.  . fluticasone (FLONASE) 50 MCG/ACT nasal spray SPRAY 2 SPRAYS INTO EACH NOSTRIL EVERY DAY  . rosuvastatin (CRESTOR) 10 MG tablet Take 1 tablet (10 mg total) by mouth daily.  . traZODone (DESYREL) 50 MG tablet TAKE 1/2 TO 1 TABLET (25-50 MG TOTAL) BY MOUTH AT BEDTIME AS NEEDED FOR SLEEP.  Marland Kitchen valACYclovir (VALTREX) 1000 MG tablet Take 1,000 mg by mouth 2 (two) times daily.   No facility-administered medications prior to visit.    Review of Systems  Constitutional: Negative.   HENT: Negative.   Eyes: Negative.   Respiratory: Negative.   Cardiovascular: Negative.   Gastrointestinal: Negative.   Endocrine: Negative.   Genitourinary: Negative.   Musculoskeletal: Negative.   Skin: Negative.   Allergic/Immunologic: Negative.   Neurological: Negative.   Hematological: Negative.   Psychiatric/Behavioral: Negative.       Objective    BP 128/84 (BP Location: Left Arm, Patient Position: Sitting, Cuff Size: Large)   Pulse 76   Temp 98.6 F (37 C) (Oral)   Ht 5\' 10"  (1.778 m)   Wt 219 lb 6.4 oz (99.5 kg)   BMI 31.48 kg/m    Physical Exam Vitals reviewed.  Constitutional:        Appearance: Normal appearance.  HENT:     Head: Normocephalic and atraumatic.     Right Ear: External ear normal.     Left Ear: External ear normal.  Eyes:     General: No scleral icterus.    Conjunctiva/sclera: Conjunctivae normal.  Cardiovascular:     Rate and Rhythm: Normal rate and regular rhythm.     Pulses: Normal pulses.     Heart sounds: Normal heart sounds.  Pulmonary:     Effort: Pulmonary effort is normal.  Breath sounds: Normal breath sounds.  Musculoskeletal:     Right lower leg: No edema.     Left lower leg: No edema.  Skin:    General: Skin is warm and dry.  Neurological:     General: No focal deficit present.     Mental Status: He is alert and oriented to person, place, and time.  Psychiatric:        Mood and Affect: Mood normal.        Behavior: Behavior normal.        Thought Content: Thought content normal.        Judgment: Judgment normal.       Last depression screening scores PHQ 2/9 Scores 03/05/2020 03/05/2019  PHQ - 2 Score 0 0  PHQ- 9 Score 0 0   Last fall risk screening Fall Risk  03/05/2020  Falls in the past year? 0  Number falls in past yr: 0  Injury with Fall? 0  Follow up Falls evaluation completed   Last Audit-C alcohol use screening Alcohol Use Disorder Test (AUDIT) 03/05/2020  1. How often do you have a drink containing alcohol? 2  2. How many drinks containing alcohol do you have on a typical day when you are drinking? 1  3. How often do you have six or more drinks on one occasion? 2  AUDIT-C Score 5  4. How often during the last year have you found that you were not able to stop drinking once you had started? 0  5. How often during the last year have you failed to do what was normally expected from you because of drinking? 0  6. How often during the last year have you needed a first drink in the morning to get yourself going after a heavy drinking session? 0  7. How often during the last year have you had a feeling of guilt of  remorse after drinking? 0  8. How often during the last year have you been unable to remember what happened the night before because you had been drinking? 0  9. Have you or someone else been injured as a result of your drinking? 0  10. Has a relative or friend or a doctor or another health worker been concerned about your drinking or suggested you cut down? 0  Alcohol Use Disorder Identification Test Final Score (AUDIT) 5   A score of 3 or more in women, and 4 or more in men indicates increased risk for alcohol abuse, EXCEPT if all of the points are from question 1   No results found for any visits on 03/05/20.  Assessment & Plan    Routine Health Maintenance and Physical Exam  Exercise Activities and Dietary recommendations Goals   None     Immunization History  Administered Date(s) Administered  . Influenza,inj,Quad PF,6+ Mos 08/19/2014, 08/31/2016, 09/16/2017, 06/22/2018  . Tdap 09/05/2009, 12/26/2019    Health Maintenance  Topic Date Due  . Hepatitis C Screening  Never done  . COVID-19 Vaccine (1) Never done  . INFLUENZA VACCINE  03/02/2020  . COLONOSCOPY  09/29/2024  . TETANUS/TDAP  12/25/2029  . HIV Screening  Completed    Discussed health benefits of physical activity, and encouraged him to engage in regular exercise appropriate for his age and condition.  1. Annual physical exam   2. Screening for colon cancer  - Ambulatory referral to Gastroenterology  3. Chronic anxiety  Continue buspar  4. Insomnia, unspecified type  Continue trazodone.  Return in about 1 year (around 03/05/2021).     The entirety of the information documented in the History of Present Illness, Review of Systems and Physical Exam were personally obtained by me. Portions of this information were initially documented by Angelica RanAtiya Cummings, CMA and reviewed by me for thoroughness and accuracy.   ITrey Sailors, Carli Lefevers M [place] Searles, PA-C, have reviewed all documentation for this visit. The  documentation on 04/01/20 for the exam, diagnosis, procedures, and orders are all accurate and complete.    Maryella ShiversAdriana M Eliodoro Gullett, PA-C  [facility] [phone] (phone) [phone] (fax)  [facility]

## 2020-03-05 ENCOUNTER — Ambulatory Visit (INDEPENDENT_AMBULATORY_CARE_PROVIDER_SITE_OTHER): Payer: BC Managed Care – PPO | Admitting: Physician Assistant

## 2020-03-05 ENCOUNTER — Encounter: Payer: Self-pay | Admitting: Physician Assistant

## 2020-03-05 ENCOUNTER — Other Ambulatory Visit: Payer: Self-pay

## 2020-03-05 VITALS — BP 128/84 | HR 76 | Temp 98.6°F | Ht 70.0 in | Wt 219.4 lb

## 2020-03-05 DIAGNOSIS — Z0000 Encounter for general adult medical examination without abnormal findings: Secondary | ICD-10-CM

## 2020-03-05 DIAGNOSIS — G4700 Insomnia, unspecified: Secondary | ICD-10-CM | POA: Diagnosis not present

## 2020-03-05 DIAGNOSIS — F419 Anxiety disorder, unspecified: Secondary | ICD-10-CM | POA: Diagnosis not present

## 2020-03-05 DIAGNOSIS — Z1211 Encounter for screening for malignant neoplasm of colon: Secondary | ICD-10-CM | POA: Diagnosis not present

## 2020-03-13 ENCOUNTER — Other Ambulatory Visit: Payer: Self-pay

## 2020-03-13 ENCOUNTER — Telehealth (INDEPENDENT_AMBULATORY_CARE_PROVIDER_SITE_OTHER): Payer: Self-pay | Admitting: Gastroenterology

## 2020-03-13 DIAGNOSIS — Z860100 Personal history of colon polyps, unspecified: Secondary | ICD-10-CM

## 2020-03-13 DIAGNOSIS — Z1211 Encounter for screening for malignant neoplasm of colon: Secondary | ICD-10-CM

## 2020-03-13 MED ORDER — NA SULFATE-K SULFATE-MG SULF 17.5-3.13-1.6 GM/177ML PO SOLN: 1.0000 | Freq: Once | ORAL | 0 refills | Status: AC

## 2020-03-13 NOTE — Progress Notes (Signed)
Gastroenterology Pre-Procedure Review  Request Date: 03/20/20 Requesting Physician: Dr. Allen Norris  PATIENT REVIEW QUESTIONS: The patient responded to the following health history questions as indicated:    1. Are you having any GI issues? no 2. Do you have a personal history of Polyps? yes (09/30/14 colonoscopy Dr. Allen Norris noted colon polyp) 3. Do you have a family history of Colon Cancer or Polyps? no 4. Diabetes Mellitus? no 5. Joint replacements in the past 12 months?no 6. Major health problems in the past 3 months?no 7. Any artificial heart valves, MVP, or defibrillator?no    MEDICATIONS & ALLERGIES:    Patient reports the following regarding taking any anticoagulation/antiplatelet therapy:   Plavix, Coumadin, Eliquis, Xarelto, Lovenox, Pradaxa, Brilinta, or Effient? no Aspirin? yes (81 mg daily)  Patient confirms/reports the following medications:  Current Outpatient Medications  Medication Sig Dispense Refill  . aspirin EC 81 MG tablet Take 81 mg by mouth daily.    . busPIRone (BUSPAR) 15 MG tablet Take 1 tablet (15 mg total) by mouth 2 (two) times daily. 180 tablet 1  . cetirizine (ZYRTEC) 10 MG tablet Take 10 mg by mouth daily.    Marland Kitchen diltiazem (CARDIZEM CD) 120 MG 24 hr capsule Take 1 capsule (120 mg total) by mouth daily. 30 capsule 6  . finasteride (PROSCAR) 5 MG tablet TAKE 1/4 TABLET BY MOUTH DAILY AS DIRECTED BY PHYSICIAN. 30 tablet 3  . fluticasone (FLONASE) 50 MCG/ACT nasal spray SPRAY 2 SPRAYS INTO EACH NOSTRIL EVERY DAY 48 mL 0  . Na Sulfate-K Sulfate-Mg Sulf 17.5-3.13-1.6 GM/177ML SOLN Take 1 kit by mouth once for 1 dose. 354 mL 0  . rosuvastatin (CRESTOR) 10 MG tablet Take 1 tablet (10 mg total) by mouth daily. 30 tablet 6  . traZODone (DESYREL) 50 MG tablet TAKE 1/2 TO 1 TABLET (25-50 MG TOTAL) BY MOUTH AT BEDTIME AS NEEDED FOR SLEEP. 90 tablet 0  . valACYclovir (VALTREX) 1000 MG tablet Take 1,000 mg by mouth 2 (two) times daily.     No current facility-administered  medications for this visit.    Patient confirms/reports the following allergies:  Allergies  Allergen Reactions  . Sulfa Antibiotics Other (See Comments)    Upset stomach    No orders of the defined types were placed in this encounter.   AUTHORIZATION INFORMATION Primary Insurance: 1D#: Group #:  Secondary Insurance: 1D#: Group #:  SCHEDULE INFORMATION: Date: Thursday 03/20/20 Time: Location:[facility]

## 2020-03-14 ENCOUNTER — Other Ambulatory Visit: Payer: Self-pay

## 2020-03-14 ENCOUNTER — Encounter: Payer: Self-pay | Admitting: Gastroenterology

## 2020-03-18 ENCOUNTER — Other Ambulatory Visit: Payer: Self-pay

## 2020-03-18 ENCOUNTER — Other Ambulatory Visit
Admission: RE | Admit: 2020-03-18 | Discharge: 2020-03-18 | Disposition: A | Discharge status: Home / Self Care | Payer: BC Managed Care – PPO | Source: Ambulatory Visit | Attending: Gastroenterology | Admitting: Gastroenterology

## 2020-03-18 DIAGNOSIS — Z01812 Encounter for preprocedural laboratory examination: Secondary | ICD-10-CM | POA: Insufficient documentation

## 2020-03-18 DIAGNOSIS — Z20822 Contact with and (suspected) exposure to covid-19: Secondary | ICD-10-CM | POA: Diagnosis not present

## 2020-03-18 LAB — SARS CORONAVIRUS 2 (TAT 6-24 HRS): SARS Coronavirus 2: NEGATIVE

## 2020-03-19 NOTE — Discharge Instructions (Signed)
General Anesthesia, Adult, Care After This sheet gives you information about how to care for yourself after your procedure. Your health care provider may also give you more specific instructions. If you have problems or questions, contact your health care provider. What can I expect after the procedure? After the procedure, the following side effects are common:  Pain or discomfort at the IV site.  Nausea.  Vomiting.  Sore throat.  Trouble concentrating.  Feeling cold or chills.  Weak or tired.  Sleepiness and fatigue.  Soreness and body aches. These side effects can affect parts of the body that were not involved in surgery. Follow these instructions at home:  For at least 24 hours after the procedure:  Have a responsible adult stay with you. It is important to have someone help care for you until you are awake and alert.  Rest as needed.  Do not: ? Participate in activities in which you could fall or become injured. ? Drive. ? Use heavy machinery. ? Drink alcohol. ? Take sleeping pills or medicines that cause drowsiness. ? Make important decisions or sign legal documents. ? Take care of children on your own. Eating and drinking  Follow any instructions from your health care provider about eating or drinking restrictions.  When you feel hungry, start by eating small amounts of foods that are soft and easy to digest (bland), such as toast. Gradually return to your regular diet.  Drink enough fluid to keep your urine pale yellow.  If you vomit, rehydrate by drinking water, juice, or clear broth. General instructions  If you have sleep apnea, surgery and certain medicines can increase your risk for breathing problems. Follow instructions from your health care provider about wearing your sleep device: ? Anytime you are sleeping, including during daytime naps. ? While taking prescription pain medicines, sleeping medicines, or medicines that make you drowsy.  Return to  your normal activities as told by your health care provider. Ask your health care provider what activities are safe for you.  Take over-the-counter and prescription medicines only as told by your health care provider.  If you smoke, do not smoke without supervision.  Keep all follow-up visits as told by your health care provider. This is important. Contact a health care provider if:  You have nausea or vomiting that does not get better with medicine.  You cannot eat or drink without vomiting.  You have pain that does not get better with medicine.  You are unable to pass urine.  You develop a skin rash.  You have a fever.  You have redness around your IV site that gets worse. Get help right away if:  You have difficulty breathing.  You have chest pain.  You have blood in your urine or stool, or you vomit blood. Summary  After the procedure, it is common to have a sore throat or nausea. It is also common to feel tired.  Have a responsible adult stay with you for the first 24 hours after general anesthesia. It is important to have someone help care for you until you are awake and alert.  When you feel hungry, start by eating small amounts of foods that are soft and easy to digest (bland), such as toast. Gradually return to your regular diet.  Drink enough fluid to keep your urine pale yellow.  Return to your normal activities as told by your health care provider. Ask your health care provider what activities are safe for you. This information is not   intended to replace advice given to you by your health care provider. Make sure you discuss any questions you have with your health care provider. Document Revised: 07/22/2017 Document Reviewed: 03/04/2017 Elsevier Patient Education  2020 Elsevier Inc.  

## 2020-03-20 ENCOUNTER — Encounter
Admission: RE | Disposition: A | Discharge status: Home / Self Care | Payer: Self-pay | Source: Home / Self Care | Attending: Gastroenterology

## 2020-03-20 ENCOUNTER — Other Ambulatory Visit: Payer: Self-pay

## 2020-03-20 ENCOUNTER — Ambulatory Visit: Payer: BC Managed Care – PPO | Admitting: Anesthesiology

## 2020-03-20 ENCOUNTER — Ambulatory Visit
Admission: RE | Admit: 2020-03-20 | Discharge: 2020-03-20 | Disposition: A | Discharge status: Home / Self Care | Payer: BC Managed Care – PPO | Attending: Gastroenterology | Admitting: Gastroenterology

## 2020-03-20 DIAGNOSIS — Z882 Allergy status to sulfonamides status: Secondary | ICD-10-CM | POA: Insufficient documentation

## 2020-03-20 DIAGNOSIS — Z1211 Encounter for screening for malignant neoplasm of colon: Secondary | ICD-10-CM | POA: Diagnosis not present

## 2020-03-20 DIAGNOSIS — L659 Nonscarring hair loss, unspecified: Secondary | ICD-10-CM | POA: Diagnosis not present

## 2020-03-20 DIAGNOSIS — Z7982 Long term (current) use of aspirin: Secondary | ICD-10-CM | POA: Insufficient documentation

## 2020-03-20 DIAGNOSIS — G4700 Insomnia, unspecified: Secondary | ICD-10-CM | POA: Diagnosis not present

## 2020-03-20 DIAGNOSIS — Z860100 Personal history of colon polyps, unspecified: Secondary | ICD-10-CM

## 2020-03-20 DIAGNOSIS — Z8616 Personal history of COVID-19: Secondary | ICD-10-CM | POA: Insufficient documentation

## 2020-03-20 DIAGNOSIS — Z8249 Family history of ischemic heart disease and other diseases of the circulatory system: Secondary | ICD-10-CM | POA: Insufficient documentation

## 2020-03-20 DIAGNOSIS — K640 First degree hemorrhoids: Secondary | ICD-10-CM | POA: Insufficient documentation

## 2020-03-20 DIAGNOSIS — Z79899 Other long term (current) drug therapy: Secondary | ICD-10-CM | POA: Insufficient documentation

## 2020-03-20 DIAGNOSIS — K219 Gastro-esophageal reflux disease without esophagitis: Secondary | ICD-10-CM | POA: Insufficient documentation

## 2020-03-20 DIAGNOSIS — K5730 Diverticulosis of large intestine without perforation or abscess without bleeding: Secondary | ICD-10-CM | POA: Diagnosis not present

## 2020-03-20 HISTORY — PX: COLONOSCOPY WITH PROPOFOL: SHX5780

## 2020-03-20 SURGERY — COLONOSCOPY WITH PROPOFOL
Anesthesia: General

## 2020-03-20 MED ORDER — LIDOCAINE HCL (CARDIAC) PF 100 MG/5ML IV SOSY
PREFILLED_SYRINGE | INTRAVENOUS | Status: DC | PRN
  Administered 2020-03-20: 30 mg via INTRAVENOUS

## 2020-03-20 MED ORDER — LACTATED RINGERS IV SOLN: INTRAVENOUS | Status: DC | PRN

## 2020-03-20 MED ORDER — STERILE WATER FOR IRRIGATION IR SOLN
Status: DC | PRN
  Administered 2020-03-20: 100 mL

## 2020-03-20 MED ORDER — PROPOFOL 10 MG/ML IV BOLUS
INTRAVENOUS | Status: DC | PRN
  Administered 2020-03-20 (×2): 40 mg via INTRAVENOUS
  Administered 2020-03-20: 240 mg via INTRAVENOUS

## 2020-03-20 SURGICAL SUPPLY — 24 items
CLIP HMST 235XBRD CATH ROT (MISCELLANEOUS) IMPLANT
CLIP RESOLUTION 360 11X235 (MISCELLANEOUS)
ELECT REM PT RETURN 9FT ADLT (ELECTROSURGICAL)
ELECTRODE REM PT RTRN 9FT ADLT (ELECTROSURGICAL) IMPLANT
FCP ESCP3.2XJMB 240X2.8X (MISCELLANEOUS)
FORCEPS BIOP RAD 4 LRG CAP 4 (CUTTING FORCEPS) IMPLANT
FORCEPS BIOP RJ4 240 W/NDL (MISCELLANEOUS)
FORCEPS ESCP3.2XJMB 240X2.8X (MISCELLANEOUS) IMPLANT
GOWN CVR UNV OPN BCK APRN NK (MISCELLANEOUS) ×2 IMPLANT
GOWN ISOL THUMB LOOP REG UNIV (MISCELLANEOUS) ×4
INJECTOR VARIJECT VIN23 (MISCELLANEOUS) IMPLANT
KIT DEFENDO VALVE AND CONN (KITS) IMPLANT
KIT ENDO PROCEDURE OLY (KITS) ×2 IMPLANT
MANIFOLD NEPTUNE II (INSTRUMENTS) ×2 IMPLANT
MARKER SPOT ENDO TATTOO 5ML (MISCELLANEOUS) IMPLANT
PROBE APC STR FIRE (PROBE) IMPLANT
RETRIEVER NET ROTH 2.5X230 LF (MISCELLANEOUS) IMPLANT
SNARE SHORT THROW 13M SML OVAL (MISCELLANEOUS) IMPLANT
SNARE SHORT THROW 30M LRG OVAL (MISCELLANEOUS) IMPLANT
SNARE SNG USE RND 15MM (INSTRUMENTS) IMPLANT
SPOT EX ENDOSCOPIC TATTOO (MISCELLANEOUS)
TRAP ETRAP POLY (MISCELLANEOUS) IMPLANT
VARIJECT INJECTOR VIN23 (MISCELLANEOUS)
WATER STERILE IRR 250ML POUR (IV SOLUTION) ×2 IMPLANT

## 2020-03-20 NOTE — Anesthesia Procedure Notes (Signed)
Date/Time: 03/20/2020 9:49 AM Performed by: Maree Krabbe, CRNA Pre-anesthesia Checklist: Patient identified, Emergency Drugs available, Suction available, Timeout performed and Patient being monitored Patient Re-evaluated:Patient Re-evaluated prior to induction Oxygen Delivery Method: Nasal cannula Placement Confirmation: positive ETCO2

## 2020-03-20 NOTE — Anesthesia Preprocedure Evaluation (Signed)
Anesthesia Evaluation  Patient identified by MRN, date of birth, ID band Patient awake    Reviewed: Allergy & Precautions, H&P , NPO status , Patient's Chart, lab work & pertinent test results  Airway Mallampati: II  TM Distance: >3 FB Neck ROM: full    Dental no notable dental hx.    Pulmonary    Pulmonary exam normal breath sounds clear to auscultation       Cardiovascular Normal cardiovascular exam Rhythm:regular Rate:Normal     Neuro/Psych    GI/Hepatic GERD  ,  Endo/Other    Renal/GU      Musculoskeletal   Abdominal   Peds  Hematology   Anesthesia Other Findings   Reproductive/Obstetrics                             Anesthesia Physical Anesthesia Plan  ASA: II  Anesthesia Plan: General   Post-op Pain Management:    Induction: Intravenous  PONV Risk Score and Plan: 2 and Treatment may vary due to age or medical condition, TIVA and Propofol infusion  Airway Management Planned: Natural Airway  Additional Equipment:   Intra-op Plan:   Post-operative Plan:   Informed Consent: I have reviewed the patients History and Physical, chart, labs and discussed the procedure including the risks, benefits and alternatives for the proposed anesthesia with the patient or authorized representative who has indicated his/her understanding and acceptance.     Dental Advisory Given  Plan Discussed with: CRNA  Anesthesia Plan Comments:         Anesthesia Quick Evaluation  

## 2020-03-20 NOTE — H&P (Signed)
Luis Minium, MD [facility] [address] [place], [place] [address] Phone:[phone] Fax : [phone]  Primary Care Physician:  Maryella Shivers Primary Gastroenterologist:  Dr. Servando Snare  Pre-Procedure History & Physical: HPI:  Luis Ray is a 54 y.o. male is here for an colonoscopy.   Past Medical History:  Diagnosis Date  . Alopecia   . History of 2019 novel coronavirus disease (COVID-19) 06/2019  . Insomnia     Past Surgical History:  Procedure Laterality Date  . ROTATOR CUFF REPAIR Right     Prior to Admission medications   Medication Sig Start Date End Date Taking? Authorizing Provider  busPIRone (BUSPAR) 15 MG tablet Take 1 tablet (15 mg total) by mouth 2 (two) times daily. 02/20/20 08/18/20 Yes [name], PA-C  cetirizine (ZYRTEC) 10 MG tablet Take 10 mg by mouth daily.   Yes [provider]  diltiazem (CARDIZEM CD) 120 MG 24 hr capsule Take 1 capsule (120 mg total) by mouth daily. 12/20/19 03/20/20 Yes Iran Ouch, MD  finasteride (PROSCAR) 5 MG tablet TAKE 1/4 TABLET BY MOUTH DAILY AS DIRECTED BY PHYSICIAN. 04/13/19  Yes Malva Limes, MD  fluticasone [facility]) 50 MCG/ACT nasal spray SPRAY 2 SPRAYS INTO EACH NOSTRIL EVERY DAY 06/20/19  Yes Osvaldo Angst M, PA-C  rosuvastatin (CRESTOR) 10 MG tablet Take 1 tablet (10 mg total) by mouth daily. 12/20/19 03/20/20 Yes Iran Ouch, MD  traZODone (DESYREL) 50 MG tablet TAKE 1/2 TO 1 TABLET (25-50 MG TOTAL) BY MOUTH AT BEDTIME AS NEEDED FOR SLEEP. 02/20/20  Yes Jodi Marble, Adriana M, PA-C  valACYclovir (VALTREX) 1000 MG tablet Take 1,000 mg by mouth 2 (two) times daily. 06/30/19  Yes [provider]  aspirin EC 81 MG tablet Take 81 mg by mouth daily.    [provider]    Allergies as of 03/13/2020 - Review Complete 03/13/2020  Allergen Reaction Noted  . Sulfa antibiotics Other (See Comments) 01/20/2015    Family History  Problem Relation Age of Onset  . Coronary  artery disease Father     Social History   Socioeconomic History  . Marital status: Married    Spouse name: Not on file  . Number of children: Not on file  . Years of education: Not on file  . Highest education level: Not on file  Occupational History  . Not on file  Tobacco Use  . Smoking status: Never Smoker  . Smokeless tobacco: Never Used  Vaping Use  . Vaping Use: Never used  Substance and Sexual Activity  . Alcohol use: Yes    Alcohol/week: 8.0 standard drinks    Types: 8 Cans of beer per week    Comment: rare  . Drug use: No  . Sexual activity: Not on file  Other Topics Concern  . Not on file  Social History Narrative  . Not on file   Social Determinants of Health   Financial Resource Strain:   . Difficulty of Paying Living Expenses: Not on file  Food Insecurity:   . Worried About Programme researcher, broadcasting/film/video in the Last Year: Not on file  . Ran Out of Food in the Last Year: Not on file  Transportation Needs:   . Lack of Transportation (Medical): Not on file  . Lack of Transportation (Non-Medical): Not on file  Physical Activity:   . Days of Exercise per Week: Not on file  . Minutes of Exercise per Session: Not on file  Stress:   .  Feeling of Stress : Not on file  Social Connections:   . Frequency of Communication with Friends and Family: Not on file  . Frequency of Social Gatherings with Friends and Family: Not on file  . Attends Religious Services: Not on file  . Active Member of Clubs or Organizations: Not on file  . Attends Banker Meetings: Not on file  . Marital Status: Not on file  Intimate Partner Violence:   . Fear of Current or Ex-Partner: Not on file  . Emotionally Abused: Not on file  . Physically Abused: Not on file  . Sexually Abused: Not on file    Review of Systems: See HPI, otherwise negative ROS  Physical Exam: BP 132/82   Pulse 63   Temp (!) 97.5 F (36.4 C) (Temporal)   Ht 5\' 10"  (1.778 m)   Wt 92.5 kg   SpO2 96%    BMI 29.27 kg/m  General:   Alert,  pleasant and cooperative in NAD Head:  Normocephalic and atraumatic. Neck:  Supple; no masses or thyromegaly. Lungs:  Clear throughout to auscultation.    Heart:  Regular rate and rhythm. Abdomen:  Soft, nontender and nondistended. Normal bowel sounds, without guarding, and without rebound.   Neurologic:  Alert and  oriented x4;  grossly normal neurologically.  Impression/Plan: Luis Ray is here for an colonoscopy to be performed for a history of adenomatous polyps on2/29/2016   Risks, benefits, limitations, and alternatives regarding  colonoscopy have been reviewed with the patient.  Questions have been answered.  All parties agreeable.   10/02/2014, MD  03/20/2020, 9:16 AM

## 2020-03-20 NOTE — Op Note (Signed)
[facility] Patient Name: Luis Ray Procedure Date: 03/20/2020 9:42 AM MRN: [id number] Account #: [id number] Date of Birth: [birth year] Admit Type: Outpatient Age: 54 Room: [facility] OR ROOM 01 Gender: Male Note Status: Finalized Procedure:             Colonoscopy Indications:           Screening for colorectal malignant neoplasm Providers:             Midge Minium MD, MD Referring MD:          Lavella Hammock. Jodi Marble (Referring MD) Medicines:             Propofol per Anesthesia Complications:         No immediate complications. Procedure:             Pre-Anesthesia Assessment:                        - Prior to the procedure, a History and Physical was                         performed, and patient medications and allergies were                         reviewed. The patient's tolerance of previous                         anesthesia was also reviewed. The risks and benefits                         of the procedure and the sedation options and risks                         were discussed with the patient. All questions were                         answered, and informed consent was obtained. Prior                         Anticoagulants: The patient has taken no previous                         anticoagulant or antiplatelet agents. ASA Grade                         Assessment: II - A patient with mild systemic disease.                         After reviewing the risks and benefits, the patient                         was deemed in satisfactory condition to undergo the                         procedure.                        After obtaining informed consent, the colonoscope was  passed under direct vision. Throughout the procedure,                         the patient's blood pressure, pulse, and oxygen                         saturations were monitored continuously. The                         Colonoscope was introduced through the anus  and                         advanced to the the cecum, identified by appendiceal                         orifice and ileocecal valve. The colonoscopy was                         performed without difficulty. The patient tolerated                         the procedure well. The quality of the bowel                         preparation was excellent. Findings:      The perianal and digital rectal examinations were normal.      A few small-mouthed diverticula were found in the entire colon.      Non-bleeding internal hemorrhoids were found during retroflexion. The       hemorrhoids were Grade I (internal hemorrhoids that do not prolapse). Impression:            - Diverticulosis in the entire examined colon.                        - Non-bleeding internal hemorrhoids.                        - No specimens collected. Recommendation:        - Discharge patient to home.                        - Resume previous diet.                        - Continue present medications.                        - Repeat colonoscopy in 7 years for surveillance. Procedure Code(s):     --- Professional ---                        [phone], Colonoscopy, flexible; diagnostic, including                         collection of specimen(s) by brushing or washing, when                         performed (separate procedure) Diagnosis Code(s):     --- Professional ---  Z12.11, Encounter for screening for malignant neoplasm                         of colon CPT copyright 2019 American Medical Association. All rights reserved. The codes documented in this report are preliminary and upon coder review may  be revised to meet current compliance requirements. Midge Minium MD, MD 03/20/2020 10:07:50 AM This report has been signed electronically. Number of Addenda: 0 Note Initiated On: 03/20/2020 9:42 AM Scope Withdrawal Time: 0 hours 7 minutes 30 seconds  Total Procedure Duration: 0 hours 12 minutes 22 seconds   Estimated Blood Loss:  Estimated blood loss: none.      [facility]

## 2020-03-20 NOTE — Anesthesia Postprocedure Evaluation (Signed)
Anesthesia Post Note  Patient: Luis Ray Eye  Procedure(s) Performed: COLONOSCOPY WITH PROPOFOL (N/A )     Patient location during evaluation: PACU Anesthesia Type: General Level of consciousness: awake and alert and oriented Pain management: satisfactory to patient Vital Signs Assessment: post-procedure vital signs reviewed and stable Respiratory status: spontaneous breathing, nonlabored ventilation and respiratory function stable Cardiovascular status: blood pressure returned to baseline and stable Postop Assessment: Adequate PO intake and No signs of nausea or vomiting Anesthetic complications: no   No complications documented.  Cherly [place]

## 2020-03-20 NOTE — Transfer of Care (Signed)
Immediate Anesthesia Transfer of Care Note  Patient: Luis Ray  Procedure(s) Performed: COLONOSCOPY WITH PROPOFOL (N/A )  Patient Location: PACU  Anesthesia Type: General  Level of Consciousness: awake, alert  and patient cooperative  Airway and Oxygen Therapy: Patient Spontanous Breathing and Patient connected to supplemental oxygen  Post-op Assessment: Post-op Vital signs reviewed, Patient's Cardiovascular Status Stable, Respiratory Function Stable, Patent Airway and No signs of Nausea or vomiting  Post-op Vital Signs: Reviewed and stable  Complications: No complications documented.

## 2020-03-21 ENCOUNTER — Encounter: Payer: Self-pay | Admitting: Gastroenterology

## 2020-04-25 ENCOUNTER — Other Ambulatory Visit: Payer: Self-pay | Admitting: Family Medicine

## 2020-04-25 NOTE — Telephone Encounter (Signed)
Requested  medications are  due for refill today yes  Requested medications are on the active medication list yes  Last refill 6/23  Last visit 03/2020  Future visit scheduled 03/2021  Notes to clinic Do not see this med/dx addressed in an OV, please assess.

## 2020-05-05 ENCOUNTER — Ambulatory Visit (INDEPENDENT_AMBULATORY_CARE_PROVIDER_SITE_OTHER): Payer: Self-pay

## 2020-05-05 ENCOUNTER — Other Ambulatory Visit: Payer: Self-pay

## 2020-05-05 DIAGNOSIS — L719 Rosacea, unspecified: Secondary | ICD-10-CM

## 2020-05-05 DIAGNOSIS — L578 Other skin changes due to chronic exposure to nonionizing radiation: Secondary | ICD-10-CM

## 2020-05-05 DIAGNOSIS — Z872 Personal history of diseases of the skin and subcutaneous tissue: Secondary | ICD-10-CM

## 2020-05-05 DIAGNOSIS — I781 Nevus, non-neoplastic: Secondary | ICD-10-CM

## 2020-05-05 NOTE — Progress Notes (Signed)
BBL followed by MLP at 15 microns to flate Sj's

## 2020-05-19 ENCOUNTER — Other Ambulatory Visit: Payer: Self-pay | Admitting: Physician Assistant

## 2020-05-19 DIAGNOSIS — G4700 Insomnia, unspecified: Secondary | ICD-10-CM

## 2020-06-02 ENCOUNTER — Ambulatory Visit: Payer: BC Managed Care – PPO

## 2020-06-10 ENCOUNTER — Other Ambulatory Visit: Payer: Self-pay | Admitting: Cardiovascular Disease

## 2020-07-10 ENCOUNTER — Ambulatory Visit: Payer: BC Managed Care – PPO | Admitting: Cardiovascular Disease

## 2020-07-10 ENCOUNTER — Other Ambulatory Visit: Payer: Self-pay

## 2020-07-10 ENCOUNTER — Encounter: Payer: Self-pay | Admitting: Cardiovascular Disease

## 2020-07-10 VITALS — BP 120/80 | HR 59 | Ht 70.0 in | Wt 218.1 lb

## 2020-07-10 DIAGNOSIS — I493 Ventricular premature depolarization: Secondary | ICD-10-CM

## 2020-07-10 DIAGNOSIS — E785 Hyperlipidemia, unspecified: Secondary | ICD-10-CM | POA: Diagnosis not present

## 2020-07-10 MED ORDER — DILTIAZEM HCL ER COATED BEADS 120 MG PO CP24: 120.0000 mg | ORAL_CAPSULE | Freq: Every day | ORAL | 3 refills | Status: DC

## 2020-07-10 MED ORDER — ROSUVASTATIN CALCIUM 10 MG PO TABS: 10.0000 mg | ORAL_TABLET | Freq: Every day | ORAL | 3 refills | Status: DC

## 2020-07-10 NOTE — Patient Instructions (Signed)
Medication Instructions:  Your physician recommends that you continue on your current medications as directed. Please refer to the Current Medication list given to you today.   Diltiazem and Crestor have been refilled today.  *If you need a refill on your cardiac medications before your next appointment, please call your pharmacy*   Lab Work: Lipid and Lft today If you have labs (blood work) drawn today and your tests are completely normal, you will receive your results only by: Marland Kitchen MyChart Message (if you have MyChart) OR . A paper copy in the mail If you have any lab test that is abnormal or we need to change your treatment, we will call you to review the results.   Testing/Procedures: None ordered   Follow-Up: At [facility], you and your health needs are our priority.  As part of our continuing mission to provide you with exceptional heart care, we have created designated Provider Care Teams.  These Care Teams include your primary Cardiologist (physician) and Advanced Practice Providers (APPs -  Physician Assistants and Nurse Practitioners) who all work together to provide you with the care you need, when you need it.  We recommend signing up for the patient portal called "MyChart".  Sign up information is provided on this After Visit Summary.  MyChart is used to connect with patients for Virtual Visits (Telemedicine).  Patients are able to view lab/test results, encounter notes, upcoming appointments, etc.  Non-urgent messages can be sent to your provider as well.   To learn more about what you can do with MyChart, go to [email or web address].    Your next appointment:   12 month(s)  The format for your next appointment:   In Person  Provider:   You may see Dr. [name] or one of the following Advanced Practice Providers on your designated Care Team:    Nicolasa Ducking, NP  Eula Listen, PA-C  Marisue Ivan, PA-C  Cadence Lansford, [place]  Gillian Shields, NP    Other  Instructions N/A

## 2020-07-10 NOTE — Progress Notes (Signed)
Cardiology Office Note   Date:  07/10/2020   ID:  Luis Ray, DOB October 08, 1965, MRN [id number]  PCP:  [name], PA-C  Cardiologist:   Lorine Bears, MD   Chief Complaint  Patient presents with  . Other    6 month f/u no complaints today. Meds reviewed verbally with pt.      History of Present Illness: Luis Ray is a 54 y.o. male who is here today for follow-up visit regarding coronary atherosclerosis and symptomatic PVCs.   He does have family history of coronary artery disease.  His father had CABG at the age of 67.  Multiple members on his mother side had myocardial infarction in their 76s and 4s. He is not a smoker.  He had COVID-19 infection in November 2020 which did not require hospitalization.  After that, he had intermittent chest pain and palpitations.  CTA of the coronary arteries was performed in May 2021 which showed mild nonobstructive coronary artery disease.  Calcium score was 86.  His echocardiogram showed normal LV systolic function with no significant valvular abnormalities and no evidence of pulmonary hypertension. PVCs responded very well to diltiazem.  He has been doing very well with no recent chest pain, shortness of breath or palpitations.  He was started on rosuvastatin for hyperlipidemia.  Past Medical History:  Diagnosis Date  . Alopecia   . History of 2019 novel coronavirus disease (COVID-19) 06/2019  . Insomnia     Past Surgical History:  Procedure Laterality Date  . COLONOSCOPY WITH PROPOFOL N/A 03/20/2020   Procedure: COLONOSCOPY WITH PROPOFOL;  Surgeon: Midge Minium, MD;  Location: [facility];  Service: Endoscopy;  Laterality: N/A;  priority 4  . ROTATOR CUFF REPAIR Right      Current Outpatient Medications  Medication Sig Dispense Refill  . aspirin EC 81 MG tablet Take 81 mg by mouth daily.    . busPIRone (BUSPAR) 15 MG tablet Take 1 tablet (15 mg total) by mouth 2 (two) times daily. 180 tablet 1  . cetirizine  (ZYRTEC) 10 MG tablet Take 10 mg by mouth daily.    Marland Kitchen diltiazem (CARDIZEM CD) 120 MG 24 hr capsule Take 1 capsule (120 mg total) by mouth daily. 30 capsule 6  . finasteride (PROSCAR) 5 MG tablet TAKE 1/4 TABLET BY MOUTH DAILY AS DIRECTED BY PHYSICIAN. 30 tablet 3  . fluticasone (FLONASE) 50 MCG/ACT nasal spray SPRAY 2 SPRAYS INTO EACH NOSTRIL EVERY DAY 48 mL 0  . rosuvastatin (CRESTOR) 10 MG tablet Take 1 tablet (10 mg total) by mouth daily. 30 tablet 6  . traZODone (DESYREL) 50 MG tablet TAKE 1/2 TO 1 TABLET (25-50 MG TOTAL) BY MOUTH AT BEDTIME AS NEEDED FOR SLEEP. 90 tablet 0  . valACYclovir (VALTREX) 1000 MG tablet Take 1,000 mg by mouth 2 (two) times daily.     No current facility-administered medications for this visit.    Allergies:   Sulfa antibiotics    Social History:  The patient  reports that he has never smoked. He has never used smokeless tobacco. He reports current alcohol use of about 8.0 standard drinks of alcohol per week. He reports that he does not use drugs.   Family History:  The patient's family history includes Coronary artery disease in his father.    ROS:  Please see the history of present illness.   Otherwise, review of systems are positive for none.   All other systems are reviewed and negative.  PHYSICAL EXAM: VS:  BP 120/80 (BP Location: Left Arm, Patient Position: Sitting, Cuff Size: Normal)   Pulse (!) 59   Ht 5\' 10"  (1.778 m)   Wt 218 lb 2 oz (98.9 kg)   SpO2 98%   BMI 31.30 kg/m  , BMI Body mass index is 31.3 kg/m. GEN: Well nourished, well developed, in no acute distress  HEENT: normal  Neck: no JVD, carotid bruits, or masses Cardiac: RRR with premature beats; no murmurs, rubs, or gallops,no edema  Respiratory:  clear to auscultation bilaterally, normal work of breathing GI: soft, nontender, nondistended, + BS MS: no deformity or atrophy  Skin: warm and dry, no rash Neuro:  Strength and sensation are intact Psych: euthymic mood, full  affect   EKG:  EKG is ordered today. The ekg ordered today demonstrates normal sinus rhythm with no significant ST or T wave changes.   Recent Labs: 11/20/2019: ALT 23; BUN 12; Creatinine, Ser 1.19; Hemoglobin 15.2; Magnesium 2.1; Platelets 238; Potassium 4.3; Sodium 142; TSH 1.040    Lipid Panel    Component Value Date/Time   CHOL 199 11/20/2019 1011   TRIG 64 11/20/2019 1011   HDL 56 11/20/2019 1011   CHOLHDL 3.6 11/20/2019 1011   LDLCALC 131 (H) 11/20/2019 1011      Wt Readings from Last 3 Encounters:  07/10/20 218 lb 2 oz (98.9 kg)  03/20/20 204 lb (92.5 kg)  03/05/20 219 lb 6.4 oz (99.5 kg)        No flowsheet data found.    ASSESSMENT AND PLAN:  1.  Mild coronary atherosclerosis: Currently with no anginal symptoms.  Continue low-dose aspirin and medical therapy.    2.  Symptomatic PVCs: Echocardiogram showed no evidence of cardiomyopathy or structural heart abnormalities.  She responded very well to diltiazem and currently with no symptoms.  3. Hyperlipidemia: LDL was 130 before starting rosuvastatin.  Check lipid and liver profile today.  Given coronary atherosclerosis, recommend a target LDL below 70.    Disposition:   FU with me in 12 months  Signed,  05/05/20, MD  07/10/2020 9:04 AM    [facility]

## 2020-07-11 ENCOUNTER — Other Ambulatory Visit: Payer: Self-pay

## 2020-07-11 LAB — HEPATIC FUNCTION PANEL
ALT: 18 IU/L (ref 0–44)
AST: 12 IU/L (ref 0–40)
Albumin: 4.6 g/dL (ref 3.8–4.9)
Alkaline Phosphatase: 64 IU/L (ref 44–121)
Bilirubin Total: <0.2 mg/dL (ref 0.0–1.2)
Bilirubin, Direct: <0.1 mg/dL (ref 0.00–0.40)
Total Protein: 6.9 g/dL (ref 6.0–8.5)

## 2020-07-11 LAB — LIPID PANEL
Chol/HDL Ratio: 3 ratio (ref 0.0–5.0)
Cholesterol, Total: 147 mg/dL (ref 100–199)
HDL: 49 mg/dL (ref >39)
LDL Chol Calc (NIH): 81 mg/dL (ref 0–99)
Triglycerides: 91 mg/dL (ref 0–149)
VLDL Cholesterol Cal: 17 mg/dL (ref 5–40)

## 2020-07-11 MED ORDER — ROSUVASTATIN CALCIUM 20 MG PO TABS: 20.0000 mg | ORAL_TABLET | Freq: Every day | ORAL | 3 refills | Status: DC

## 2020-07-14 ENCOUNTER — Other Ambulatory Visit: Payer: Self-pay | Admitting: Physician Assistant

## 2020-07-14 DIAGNOSIS — G4700 Insomnia, unspecified: Secondary | ICD-10-CM

## 2020-07-18 ENCOUNTER — Other Ambulatory Visit: Payer: Self-pay

## 2020-07-18 ENCOUNTER — Telehealth: Payer: BC Managed Care – PPO | Admitting: Nurse Practitioner

## 2020-07-18 DIAGNOSIS — J324 Chronic pansinusitis: Secondary | ICD-10-CM

## 2020-07-18 MED ORDER — AMOXICILLIN-POT CLAVULANATE 875-125 MG PO TABS
1.0000 | ORAL_TABLET | Freq: Two times a day (BID) | ORAL | 0 refills | Status: DC
Start: 2020-07-18 — End: 2021-08-14

## 2020-07-18 NOTE — Progress Notes (Signed)
   Subjective:    Patient ID: Luis Ray, male    DOB: Dec 01, 1965, 54 y.o.   MRN: [id number]  HPI  54 year old male telehealth visit with complaints of 2 weeks of Fatigue congestion sinus pressure behind eyes a productive cough  Has used Alka seltzer cold plus He has been able to work  Mucous is now lime green worse in the am  Has had a sinus infection in the past  Denies asthma   Had Covid one year ago- did get double pneumonia after that  Has been vaccinated since the time denies any sick contacts   Allergies  Allergen Reactions  . Sulfa Antibiotics Other (See Comments)    Upset stomach    Past Medical History:  Diagnosis Date  . Alopecia   . History of 2019 novel coronavirus disease (COVID-19) 06/2019  . Insomnia     Review of Systems  Constitutional: Positive for fatigue.  HENT: Positive for congestion, postnasal drip, rhinorrhea and sinus pressure.   Eyes: Negative.   Respiratory: Positive for cough.   Cardiovascular: Negative.   Musculoskeletal: Negative.   Neurological: Negative.        Objective:   Physical Exam  This was a virtual appointment with patient, in no acute distress during phone conversation, able to have conversation without SOB, cough witnessed.        Assessment & Plan:  Discussed with patient symptoms are consistent with sinusitis. Since symptoms have been present for 2 weeks if origin was COVID he would not longer be considered contagious or require isolation. Cannot rule out COVID as origin.  Advised continuing allergy regimen flonase and zyrtec Increasing water intake to help mobilize secretions Start antibiotic, if not improving after weekend, or with any increased respiratory symptoms advised f/u with PCP as Occupational health will be closed next week   Seek medical attention for any acutely worsening symptoms   Meds ordered this encounter  Medications  . amoxicillin-clavulanate (AUGMENTIN) 875-125 MG tablet    Sig: Take 1  tablet by mouth 2 (two) times daily.    Dispense:  20 tablet    Refill:  0

## 2020-07-29 DIAGNOSIS — Z20828 Contact with and (suspected) exposure to other viral communicable diseases: Secondary | ICD-10-CM | POA: Diagnosis not present

## 2020-07-30 ENCOUNTER — Other Ambulatory Visit: Payer: Self-pay

## 2020-07-30 ENCOUNTER — Telehealth: Payer: BC Managed Care – PPO | Admitting: Nurse Practitioner

## 2020-07-30 NOTE — Progress Notes (Signed)
   Subjective:    Patient ID: Luis Ray, male    DOB: 1966/06/26, 54 y.o.   MRN: [id number]  HPI  54 year old male had a previous visit on 07/18/20 for sinusitis finished Augmentin yesterday overall improving but continues to have clear drainage mostly in the am.   Has been using Flonase and Zyrtec for relief.   History of arrhythmia takes Diltiazem     Review of Systems  HENT: Positive for congestion and rhinorrhea.   Respiratory: Negative.   Cardiovascular: Negative.   Genitourinary: Negative.   Neurological: Negative.        Objective:   Physical Exam  This was a telehealth appointment with a patient during COVID-19 pandemic.   No acute distress assessed during phone conversation with patient        Assessment & Plan:   Advised trying Coricidin OTC for congestion relief, to continue fluid intake to maintain sinus drainage.   RTC next week if congestion persists or changes or with new symptoms as discussed.

## 2020-08-11 ENCOUNTER — Other Ambulatory Visit: Payer: Self-pay | Admitting: Physician Assistant

## 2020-08-11 DIAGNOSIS — F419 Anxiety disorder, unspecified: Secondary | ICD-10-CM

## 2020-08-13 ENCOUNTER — Other Ambulatory Visit: Payer: Self-pay

## 2020-08-13 ENCOUNTER — Ambulatory Visit
Admission: EM | Admit: 2020-08-13 | Discharge: 2020-08-13 | Disposition: A | Discharge status: Home / Self Care | Payer: BC Managed Care – PPO | Attending: Family Medicine | Admitting: Family Medicine

## 2020-08-13 ENCOUNTER — Encounter: Payer: Self-pay | Admitting: Family Medicine

## 2020-08-13 DIAGNOSIS — Z20822 Contact with and (suspected) exposure to covid-19: Secondary | ICD-10-CM | POA: Diagnosis not present

## 2020-08-13 NOTE — ED Triage Notes (Signed)
Pt presents with complaints of mild chest congestion and runny nose since Sunday. Wife is positive for covid.

## 2020-08-16 LAB — NOVEL CORONAVIRUS, NAA: SARS-CoV-2, NAA: DETECTED — AB

## 2020-08-20 ENCOUNTER — Other Ambulatory Visit: Payer: Self-pay | Admitting: Physician Assistant

## 2020-08-20 MED ORDER — FLUTICASONE PROPIONATE 50 MCG/ACT NA SUSP: 2.0000 | Freq: Every day | NASAL | 0 refills | Status: DC

## 2020-11-07 ENCOUNTER — Ambulatory Visit: Payer: BC Managed Care – PPO | Admitting: Nurse Practitioner

## 2020-11-07 ENCOUNTER — Other Ambulatory Visit: Payer: Self-pay

## 2020-11-07 ENCOUNTER — Ambulatory Visit
Admission: RE | Admit: 2020-11-07 | Discharge: 2020-11-07 | Disposition: A | Discharge status: Home / Self Care | Payer: BC Managed Care – PPO | Source: Ambulatory Visit | Attending: Nurse Practitioner | Admitting: Nurse Practitioner

## 2020-11-07 ENCOUNTER — Encounter: Payer: Self-pay | Admitting: Nurse Practitioner

## 2020-11-07 VITALS — BP 132/86 | HR 70 | Temp 98.3°F | Resp 16 | Ht 70.0 in | Wt 215.0 lb

## 2020-11-07 DIAGNOSIS — R062 Wheezing: Secondary | ICD-10-CM | POA: Diagnosis not present

## 2020-11-07 DIAGNOSIS — R0602 Shortness of breath: Secondary | ICD-10-CM

## 2020-11-07 NOTE — Progress Notes (Signed)
   Subjective:    Patient ID: Luis Ray, male    DOB: [birth year], 55 y.o.   MRN: [id number]  HPI  55 year old male presenting with ongoing respiratory complaints.  In November of 2020 he had COVID-19. He was given inhalers at that time.   He had COVID-19 again in January of this year (2022). Since that time he has become ill again with what was diagnosed with a sinus infection and bronchitis at a local urgent care.   He feels as though his lungs "hurt" it feels better when he rubs his sides or squeezes his lungs- like a tight hug relieves his discomfort.   He has not had a sleep apnea test in the past but has used a CPAP before. He does snore. Has had sinus surgery in the past.   Smoked for 2 years in his 3s has not since.  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   He has been to a cardiologist in the past year.   Today's Vitals   11/07/20 1017  BP: 132/86  Pulse: 70  Resp: 16  Temp: 98.3 F (36.8 C)  TempSrc: Tympanic  SpO2: 96%  Weight: 215 lb (97.5 kg)  Height: 5\' 10"  (1.778 m)   Body mass index is 30.85 kg/m.  Review of Systems  Constitutional: Negative.   HENT: Negative.   Respiratory: Positive for cough, shortness of breath and wheezing.   Cardiovascular: Negative.   Genitourinary: Negative.   Musculoskeletal: Negative.   Neurological: Negative.   Psychiatric/Behavioral: Negative.    Past Medical History:  Diagnosis Date  . Alopecia   . History of 2019 novel coronavirus disease (COVID-19) 06/2019  . Insomnia       Objective:   Physical Exam Pulmonary:     Effort: Pulmonary effort is normal.     Breath sounds: Normal breath sounds. No wheezing.  Chest:     Chest wall: No tenderness.  Musculoskeletal:     Cervical back: Normal range of motion.  Neurological:     General: No focal deficit present.     Mental Status: He is alert.  Psychiatric:        Mood  and Affect: Mood normal.           Assessment & Plan:  Will send for Chest XRAY to evaluate for ongoing process/ underlying disease or DDx potential long COVID Follow up with results and plan according to XRAY results  Recommended he f/u with PCP for sleep study   May use albuterol inhaler as needed  Discussed increasing cardiovascular exercise  XRAY results normal- see telephone encounter. Patient advised to follow up with PCP and discuss sleep apnea testing   Ddx long COVID

## 2020-11-08 ENCOUNTER — Encounter: Payer: Self-pay | Admitting: Nurse Practitioner

## 2020-11-09 NOTE — Telephone Encounter (Signed)
Called patient to discuss XRAY results.   Normal as per impression below  Advised f/u with PCP and discussion on sleep apnea testing.   RTC as needed.   DATA:  Ongoing shortness of breath with recurrent wheezing. COVID-20 August 2020.  EXAM: CHEST - 2 VIEW  COMPARISON:  Radiograph 07/08/2019  FINDINGS: Previous small pulmonary opacities on prior exam have resolved. No residual or recurrent airspace disease. The cardiomediastinal contours are normal. No bronchial thickening. Pulmonary vasculature is normal. No consolidation, pleural effusion, or pneumothorax. No acute osseous abnormalities are seen.  IMPRESSION: No acute chest findings. Previous small pulmonary opacities on prior exam have resolved without residual.

## 2020-11-15 ENCOUNTER — Other Ambulatory Visit: Payer: Self-pay | Admitting: Medical

## 2021-02-06 ENCOUNTER — Telehealth: Payer: Self-pay

## 2021-02-06 DIAGNOSIS — F419 Anxiety disorder, unspecified: Secondary | ICD-10-CM

## 2021-02-06 MED ORDER — BUSPIRONE HCL 15 MG PO TABS
15.0000 mg | ORAL_TABLET | Freq: Two times a day (BID) | ORAL | 0 refills | Status: DC
Start: 2021-02-06 — End: 2021-05-04

## 2021-02-06 NOTE — Telephone Encounter (Signed)
[facility] faxed refill request for the following medications:   busPIRone (BUSPAR) 15 MG tablet    Please advise.  

## 2021-02-13 ENCOUNTER — Other Ambulatory Visit: Payer: Self-pay | Admitting: Medical

## 2021-03-05 NOTE — Progress Notes (Signed)
Complete physical exam   Patient: Luis Ray   DOB: 01/03/1966   55 y.o. Male  MRN: [id number] Visit Date: 03/06/2021  Today's healthcare provider: Jacky KindleElise T Eulia Hatcher, FNP   Chief Complaint  Patient presents with   Annual Exam    Subjective    Luis Ray is a 55 y.o. male who presents today for a complete physical exam.  He reports consuming a general diet. Exercise is limited by orthopedic condition(s): knee pain, shoulder pain. He generally feels well. He reports sleeping fairly well. He does not have additional problems to discuss today.  HPI    Past Medical History:  Diagnosis Date   Alopecia    History of 2019 novel coronavirus disease (COVID-19) 06/2019   Insomnia    Past Surgical History:  Procedure Laterality Date   COLONOSCOPY WITH PROPOFOL N/A 03/20/2020   Procedure: COLONOSCOPY WITH PROPOFOL;  Surgeon: Midge MiniumWohl, Darren, MD;  Location[facility];  Service: Endoscopy;  Laterality: N/A;  priority 4   ROTATOR CUFF REPAIR Right    Social History   Socioeconomic History   Marital status: Married    Spouse name: Not on file   Number of children: Not on file   Years of education: Not on file   Highest education level: Not on file  Occupational History   Not on file  Tobacco Use   Smoking status: Never   Smokeless tobacco: Never  Vaping Use   Vaping Use: Never used  Substance and Sexual Activity   Alcohol use: Yes    Alcohol/week: 8.0 standard drinks    Types: 8 Cans of beer per week    Comment: rare   Drug use: No   Sexual activity: Not on file  Other Topics Concern   Not on file  Social History Narrative   Not on file   Social Determinants of Health   Financial Resource Strain: Not on file  Food Insecurity: Not on file  Transportation Needs: Not on file  Physical Activity: Not on file  Stress: Not on file  Social Connections: Not on file  Intimate Partner Violence: Not on file   Family Status  Relation Name Status   Mother  Alive    Father  Alive   Mat Uncle  Deceased       heart attack   MGF  Deceased       heart attack   Family History  Problem Relation Age of Onset   Coronary artery disease Father    Allergies  Allergen Reactions   Sulfa Antibiotics Other (See Comments)    Upset stomach    Patient Care Team: Jacky KindlePayne, Laurette Villescas T, FNP as PCP - General (Family Medicine)   Medications: Outpatient Medications Prior to Visit  Medication Sig   amoxicillin-clavulanate (AUGMENTIN) 875-125 MG tablet Take 1 tablet by mouth 2 (two) times daily.   aspirin EC 81 MG tablet Take 81 mg by mouth daily.   busPIRone (BUSPAR) 15 MG tablet Take 1 tablet (15 mg total) by mouth 2 (two) times daily.   cetirizine (ZYRTEC) 10 MG tablet Take 10 mg by mouth daily.   finasteride (PROSCAR) 5 MG tablet TAKE 1/4 TABLET BY MOUTH DAILY AS DIRECTED BY PHYSICIAN.   fluticasone (FLONASE) 50 MCG/ACT nasal spray SPRAY 2 SPRAYS INTO EACH NOSTRIL EVERY DAY   traZODone (DESYREL) 50 MG tablet TAKE 1/2 TO 1 TABLET (25-50 MG TOTAL) BY MOUTH AT BEDTIME AS NEEDED FOR SLEEP.   valACYclovir (VALTREX) 1000 MG  tablet Take 1,000 mg by mouth 2 (two) times daily.   diltiazem (CARDIZEM CD) 120 MG 24 hr capsule Take 1 capsule (120 mg total) by mouth daily.   rosuvastatin (CRESTOR) 20 MG tablet Take 1 tablet (20 mg total) by mouth daily.   No facility-administered medications prior to visit.    Review of Systems  Constitutional: Negative.   HENT:  Positive for hearing loss and tinnitus.   Eyes: Negative.   Respiratory: Negative.    Cardiovascular: Negative.   Gastrointestinal: Negative.   Endocrine: Negative.   Genitourinary: Negative.   Musculoskeletal:  Positive for arthralgias.  Skin: Negative.   Allergic/Immunologic: Negative.   Neurological: Negative.   Hematological: Negative.   Psychiatric/Behavioral: Negative.       Objective    BP 124/77 (BP Location: Right Arm, Patient Position: Sitting, Cuff Size: Normal)   Pulse 60   Temp 98.3 F  (36.8 C) (Oral)   Wt 219 lb (99.3 kg)   SpO2 98%   BMI 31.42 kg/m    Physical Exam Constitutional:      General: He is not in acute distress.    Appearance: Normal appearance. He is obese. He is not ill-appearing or toxic-appearing.  HENT:     Head: Normocephalic and atraumatic.     Jaw: There is normal jaw occlusion. No tenderness, swelling or pain on movement.     Right Ear: Tympanic membrane, ear canal and external ear normal.     Left Ear: Tympanic membrane, ear canal and external ear normal.     Ears:     Comments: Difficulty with noise; occasionally ringing in the ear.   Uses ear protection when shooting or using lawn equipment.   Reinforced.    Nose: Nose normal.     Mouth/Throat:     Mouth: Mucous membranes are moist.  Eyes:     General: Lids are normal.     Extraocular Movements: Extraocular movements intact.     Conjunctiva/sclera: Conjunctivae normal.     Pupils: Pupils are equal, round, and reactive to light.  Neck:     Thyroid: No thyromegaly or thyroid tenderness.  Cardiovascular:     Rate and Rhythm: Normal rate and regular rhythm.     Pulses: Normal pulses.          Carotid pulses are 2+ on the right side and 2+ on the left side.      Radial pulses are 2+ on the right side and 2+ on the left side.       Femoral pulses are 2+ on the right side and 2+ on the left side.      Popliteal pulses are 2+ on the right side and 2+ on the left side.       Dorsalis pedis pulses are 2+ on the right side and 2+ on the left side.       Posterior tibial pulses are 2+ on the right side and 2+ on the left side.     Heart sounds: Normal heart sounds, S1 normal and S2 normal. No murmur heard.   No friction rub. No gallop.  Pulmonary:     Effort: Pulmonary effort is normal.     Breath sounds: Normal breath sounds.  Chest:  Breasts:    Right: Normal.     Left: Normal.  Abdominal:     General: Bowel sounds are normal. There is no distension.     Palpations: Abdomen is  soft. There is no mass.  Tenderness: There is no abdominal tenderness. There is no guarding or rebound.  Genitourinary:    Comments: Exam deferred; no complaints Musculoskeletal:        General: Normal range of motion.     Cervical back: Normal range of motion and neck supple. No tenderness.     Right lower leg: No edema.     Left lower leg: No edema.     Comments: Generalized complaints with overuse- throwing ball with son, sitting for awhile.  Described as stiffness.  Feet:     Right foot:     Skin integrity: Skin integrity normal.     Left foot:     Skin integrity: Skin integrity normal.  Lymphadenopathy:     Cervical: No cervical adenopathy.  Skin:    General: Skin is warm and dry.     Capillary Refill: Capillary refill takes 2 to 3 seconds.  Neurological:     General: No focal deficit present.     Mental Status: He is alert and oriented to person, place, and time. Mental status is at baseline.     Cranial Nerves: Cranial nerves are intact.     Sensory: Sensation is intact.     Motor: Motor function is intact.     Coordination: Coordination is intact.     Gait: Gait is intact.  Psychiatric:        Attention and Perception: Attention and perception normal.        Mood and Affect: Mood and affect normal.        Speech: Speech normal.        Behavior: Behavior normal. Behavior is cooperative.        Thought Content: Thought content normal.        Cognition and Memory: Cognition and memory normal.        Judgment: Judgment normal.      Last depression screening scores PHQ 2/9 Scores 03/06/2021 03/05/2020 03/05/2019  PHQ - 2 Score 0 0 0  PHQ- 9 Score 0 0 0   Last fall risk screening Fall Risk  03/06/2021  Falls in the past year? 0  Number falls in past yr: 0  Injury with Fall? 0  Follow up -   Last Audit-C alcohol use screening Alcohol Use Disorder Test (AUDIT) 03/06/2021  1. How often do you have a drink containing alcohol? 2  2. How many drinks containing alcohol do  you have on a typical day when you are drinking? 0  3. How often do you have six or more drinks on one occasion? 0  AUDIT-C Score 2  4. How often during the last year have you found that you were not able to stop drinking once you had started? -  5. How often during the last year have you failed to do what was normally expected from you because of drinking? -  6. How often during the last year have you needed a first drink in the morning to get yourself going after a heavy drinking session? -  7. How often during the last year have you had a feeling of guilt of remorse after drinking? -  8. How often during the last year have you been unable to remember what happened the night before because you had been drinking? -  9. Have you or someone else been injured as a result of your drinking? -  10. Has a relative or friend or a doctor or another health worker been concerned about your drinking or  suggested you cut down? -  Alcohol Use Disorder Identification Test Final Score (AUDIT) -   A score of 3 or more in women, and 4 or more in men indicates increased risk for alcohol abuse, EXCEPT if all of the points are from question 1   No results found for any visits on 03/06/21.  Assessment & Plan    Routine Health Maintenance and Physical Exam  Exercise Activities and Dietary recommendations  Goals   None     Immunization History  Administered Date(s) Administered   Influenza,inj,Quad PF,6+ Mos 08/19/2014, 08/31/2016, 09/16/2017, 06/22/2018   Tdap 09/05/2009, 12/26/2019    Health Maintenance  Topic Date Due   COVID-19 Vaccine (1) Never done   Hepatitis C Screening  Never done   Zoster Vaccines- Shingrix (1 of 2) Never done   INFLUENZA VACCINE  03/02/2021   COLONOSCOPY (Pts 45-16yrs Insurance coverage will need to be confirmed)  03/21/2027   TETANUS/TDAP  12/25/2029   HIV Screening  Completed   Pneumococcal Vaccine 71-19 Years old  Aged Out   HPV VACCINES  Aged Out    Discussed  health benefits of physical activity, and encouraged him to engage in regular exercise appropriate for his age and condition.  Problem List Items Addressed This Visit       Endocrine   Alopecia, male pattern    Using proscar sparingly.  Does not notice much improvement; however, still notes that he has hair.  'I don't have the head of hair that I used to.'         Other   Herpes    Occ. Cold sores  Worse following sun exposure.  Using PRN; takes at first sign of viral infection.  Works well.       Hypercholesterolemia without hypertriglyceridemia    Stable at this time.  Repeat lab work.  Continue statin use.  recommend diet low in saturated fat and regular exercise - 30 min at least 5 times per week        Chronic anxiety    Remains stable.  On buspar BID at 15; denies need for dose adjustment.  Notes that buspar occ will interfere with sleep and then uses trazodone to assist.  Combination working well.        Routine general medical examination at a health care facility - Primary    Annual physical; no immunizations needed.  Not due for colonoscopy until 2028.  Reinforced regular dental and eye care.  Things to do to keep yourself healthy  - Exercise at least 30-45 minutes a day, 3-4 days a week.  - Eat a low-fat diet with lots of fruits and vegetables, up to 7-9 servings per day.  - Seatbelts can save your life. Wear them always.  - Smoke detectors on every level of your home, check batteries every year.  - Eye Doctor - have an eye exam every 1-2 years  - Safe sex - if you may be exposed to STDs, use a condom.  - Alcohol -  If you drink, do it moderately, less than 2 drinks per day.  - Health Care Power of Attorney. Choose someone to speak for you if you are not able.  - Depression is common in our stressful world.If you're feeling down or losing interest in things you normally enjoy, please come in for a visit.  - Violence - If anyone is  threatening or hurting you, please call immediately.         Relevant Orders  CBC with Differential/Platelet   Comprehensive metabolic panel   Lipid Panel With LDL/HDL Ratio   TSH   PSA   Chronic pain of both knees    Stiffness  Worse when in one position for a long time  Mobic ordered for trial  Continue to recommend icing and prevent overuse       Relevant Medications   meloxicam (MOBIC) 7.5 MG tablet   Pain of both shoulder joints    Previous repair of rotator cuff as well as bicep  Pain noted with repeated use; ex- playing ball with son.  Recommend hydration to lubricate joints as well as trial of mobic.         Return in about 1 year (around 03/06/2022) for annual examination.     Leilani Merl, FNP, have reviewed all documentation for this visit. The documentation on 03/06/21 for the exam, diagnosis, procedures, and orders are all accurate and complete.    Jacky Kindle, FNP  [facility] [phone] (phone) [phone] (fax)  [facility]

## 2021-03-06 ENCOUNTER — Other Ambulatory Visit: Payer: Self-pay

## 2021-03-06 ENCOUNTER — Encounter: Payer: Self-pay | Admitting: Physician Assistant

## 2021-03-06 ENCOUNTER — Encounter: Payer: Self-pay | Admitting: Family Medicine

## 2021-03-06 ENCOUNTER — Ambulatory Visit (INDEPENDENT_AMBULATORY_CARE_PROVIDER_SITE_OTHER): Payer: BC Managed Care – PPO | Admitting: Family Medicine

## 2021-03-06 VITALS — BP 124/77 | HR 60 | Temp 98.3°F | Wt 219.0 lb

## 2021-03-06 DIAGNOSIS — L649 Androgenic alopecia, unspecified: Secondary | ICD-10-CM | POA: Diagnosis not present

## 2021-03-06 DIAGNOSIS — Z0000 Encounter for general adult medical examination without abnormal findings: Secondary | ICD-10-CM | POA: Diagnosis not present

## 2021-03-06 DIAGNOSIS — B009 Herpesviral infection, unspecified: Secondary | ICD-10-CM

## 2021-03-06 DIAGNOSIS — M25511 Pain in right shoulder: Secondary | ICD-10-CM | POA: Insufficient documentation

## 2021-03-06 DIAGNOSIS — M25561 Pain in right knee: Secondary | ICD-10-CM

## 2021-03-06 DIAGNOSIS — M25512 Pain in left shoulder: Secondary | ICD-10-CM

## 2021-03-06 DIAGNOSIS — E7800 Pure hypercholesterolemia, unspecified: Secondary | ICD-10-CM

## 2021-03-06 DIAGNOSIS — F419 Anxiety disorder, unspecified: Secondary | ICD-10-CM

## 2021-03-06 DIAGNOSIS — G8929 Other chronic pain: Secondary | ICD-10-CM | POA: Insufficient documentation

## 2021-03-06 DIAGNOSIS — M25562 Pain in left knee: Secondary | ICD-10-CM

## 2021-03-06 MED ORDER — MELOXICAM 7.5 MG PO TABS: 7.5000 mg | ORAL_TABLET | Freq: Every day | ORAL | 1 refills | Status: DC

## 2021-03-06 NOTE — Assessment & Plan Note (Signed)
Previous repair of rotator cuff as well as bicep  Pain noted with repeated use; ex- playing ball with son.  Recommend hydration to lubricate joints as well as trial of mobic.

## 2021-03-06 NOTE — Assessment & Plan Note (Signed)
Using proscar sparingly.  Does not notice much improvement; however, still notes that he has hair.  'I don't have the head of hair that I used to.'

## 2021-03-06 NOTE — Assessment & Plan Note (Signed)
Stable at this time.  Repeat lab work.  Continue statin use.  recommend diet low in saturated fat and regular exercise - 30 min at least 5 times per week

## 2021-03-06 NOTE — Assessment & Plan Note (Signed)
Remains stable.  On buspar BID at 15; denies need for dose adjustment.  Notes that buspar occ will interfere with sleep and then uses trazodone to assist.  Combination working well.

## 2021-03-06 NOTE — Assessment & Plan Note (Signed)
Annual physical; no immunizations needed.  Not due for colonoscopy until 2028.  Reinforced regular dental and eye care.  Things to do to keep yourself healthy  - Exercise at least 30-45 minutes a day, 3-4 days a week.  - Eat a low-fat diet with lots of fruits and vegetables, up to 7-9 servings per day.  - Seatbelts can save your life. Wear them always.  - Smoke detectors on every level of your home, check batteries every year.  - Eye Doctor - have an eye exam every 1-2 years  - Safe sex - if you may be exposed to STDs, use a condom.  - Alcohol -  If you drink, do it moderately, less than 2 drinks per day.  - Health Care Power of Attorney. Choose someone to speak for you if you are not able.  - Depression is common in our stressful world.If you're feeling down or losing interest in things you normally enjoy, please come in for a visit.  - Violence - If anyone is threatening or hurting you, please call immediately.

## 2021-03-06 NOTE — Assessment & Plan Note (Signed)
Stiffness  Worse when in one position for a long time  Mobic ordered for trial  Continue to recommend icing and prevent overuse

## 2021-03-06 NOTE — Assessment & Plan Note (Signed)
Occ. Cold sores  Worse following sun exposure.  Using PRN; takes at first sign of viral infection.  Works well.

## 2021-03-07 LAB — CBC WITH DIFFERENTIAL/PLATELET
Basophils Absolute: 0 10*3/uL (ref 0.0–0.2)
Basos: 1 %
EOS (ABSOLUTE): 0.2 10*3/uL (ref 0.0–0.4)
Eos: 3 %
Hematocrit: 44.5 % (ref 37.5–51.0)
Hemoglobin: 15 g/dL (ref 13.0–17.7)
Immature Grans (Abs): 0 10*3/uL (ref 0.0–0.1)
Immature Granulocytes: 1 %
Lymphocytes Absolute: 1.7 10*3/uL (ref 0.7–3.1)
Lymphs: 28 %
MCH: 29.8 pg (ref 26.6–33.0)
MCHC: 33.7 g/dL (ref 31.5–35.7)
MCV: 89 fL (ref 79–97)
Monocytes Absolute: 0.6 10*3/uL (ref 0.1–0.9)
Monocytes: 10 %
Neutrophils Absolute: 3.6 10*3/uL (ref 1.4–7.0)
Neutrophils: 57 %
Platelets: 221 10*3/uL (ref 150–450)
RBC: 5.03 x10E6/uL (ref 4.14–5.80)
RDW: 12.8 % (ref 11.6–15.4)
WBC: 6.1 10*3/uL (ref 3.4–10.8)

## 2021-03-07 LAB — COMPREHENSIVE METABOLIC PANEL
ALT: 15 IU/L (ref 0–44)
AST: 13 IU/L (ref 0–40)
Albumin/Globulin Ratio: 2.1 (ref 1.2–2.2)
Albumin: 4.6 g/dL (ref 3.8–4.9)
Alkaline Phosphatase: 59 IU/L (ref 44–121)
BUN/Creatinine Ratio: 13 (ref 9–20)
BUN: 17 mg/dL (ref 6–24)
Bilirubin Total: 0.3 mg/dL (ref 0.0–1.2)
CO2: 24 mmol/L (ref 20–29)
Calcium: 9.7 mg/dL (ref 8.7–10.2)
Chloride: 106 mmol/L (ref 96–106)
Creatinine, Ser: 1.3 mg/dL — ABNORMAL HIGH (ref 0.76–1.27)
Globulin, Total: 2.2 g/dL (ref 1.5–4.5)
Glucose: 82 mg/dL (ref 65–99)
Potassium: 4.4 mmol/L (ref 3.5–5.2)
Sodium: 146 mmol/L — ABNORMAL HIGH (ref 134–144)
Total Protein: 6.8 g/dL (ref 6.0–8.5)
eGFR: 64 mL/min/{1.73_m2} (ref >59)

## 2021-03-07 LAB — TSH: TSH: 1.12 u[IU]/mL (ref 0.450–4.500)

## 2021-03-07 LAB — LIPID PANEL WITH LDL/HDL RATIO
Cholesterol, Total: 148 mg/dL (ref 100–199)
HDL: 49 mg/dL (ref >39)
LDL Chol Calc (NIH): 73 mg/dL (ref 0–99)
LDL/HDL Ratio: 1.5 ratio (ref 0.0–3.6)
Triglycerides: 148 mg/dL (ref 0–149)
VLDL Cholesterol Cal: 26 mg/dL (ref 5–40)

## 2021-03-07 LAB — PSA: Prostate Specific Ag, Serum: <0.1 ng/mL (ref 0.0–4.0)

## 2021-04-22 ENCOUNTER — Other Ambulatory Visit: Payer: Self-pay | Admitting: Family Medicine

## 2021-04-22 DIAGNOSIS — M25562 Pain in left knee: Secondary | ICD-10-CM

## 2021-04-22 DIAGNOSIS — G8929 Other chronic pain: Secondary | ICD-10-CM

## 2021-04-27 ENCOUNTER — Other Ambulatory Visit: Payer: Self-pay | Admitting: Family Medicine

## 2021-05-04 ENCOUNTER — Other Ambulatory Visit: Payer: Self-pay | Admitting: Family Medicine

## 2021-05-04 DIAGNOSIS — F419 Anxiety disorder, unspecified: Secondary | ICD-10-CM

## 2021-05-14 ENCOUNTER — Other Ambulatory Visit: Payer: Self-pay | Admitting: Medical

## 2021-06-26 ENCOUNTER — Other Ambulatory Visit: Payer: Self-pay | Admitting: Family Medicine

## 2021-06-26 DIAGNOSIS — G8929 Other chronic pain: Secondary | ICD-10-CM

## 2021-06-26 NOTE — Telephone Encounter (Signed)
Requested Prescriptions  Pending Prescriptions Disp Refills  . meloxicam (MOBIC) 7.5 MG tablet [Pharmacy Med Name: MELOXICAM 7.5 MG TABLET] 30 tablet 1    Sig: TAKE 1 TABLET BY MOUTH EVERY DAY     Analgesics:  COX2 Inhibitors Failed - 06/26/2021  5:59 AM      Failed - Cr in normal range and within 360 days    Creatinine, Ser  Date Value Ref Range Status  03/06/2021 1.30 (H) 0.76 - 1.27 mg/dL Final         Passed - HGB in normal range and within 360 days    Hemoglobin  Date Value Ref Range Status  03/06/2021 15.0 13.0 - 17.7 g/dL Final         Passed - Patient is not pregnant      Passed - Valid encounter within last 12 months    Recent Outpatient Visits          3 months ago Routine general medical examination at a health care facility   [facility] Merita Norton T, FNP   1 year ago Annual physical exam   [facility] [name], [place]   1 year ago Chest discomfort   [facility] Osvaldo Angst M, [place]   1 year ago History of 2019 novel coronavirus disease (COVID-19)   [facility] [place], [place], [place]   2 years ago Chronic anxiety   [facility] [place], Lavella Hammock, [place]      Future Appointments            In 8 months Jacky Kindle, FNP [employer] [facility] [employer], [facility]

## 2021-07-06 ENCOUNTER — Telehealth: Payer: Self-pay

## 2021-07-06 MED ORDER — FINASTERIDE 5 MG PO TABS: ORAL_TABLET | ORAL | 3 refills | Status: DC

## 2021-07-06 NOTE — Telephone Encounter (Signed)
Pharmacy requesting Finasteride 5 MG  qty 30 for 3 refills

## 2021-07-06 NOTE — Addendum Note (Signed)
Addended by: Marlene Lard on: 07/06/2021 01:58 PM   Modules accepted: Orders

## 2021-07-09 ENCOUNTER — Other Ambulatory Visit: Payer: Self-pay | Admitting: Cardiovascular Disease

## 2021-07-09 NOTE — Telephone Encounter (Signed)
LMOV  

## 2021-07-09 NOTE — Telephone Encounter (Signed)
Please contact pt for future appointment. Pt is due for 12 month f/u. 

## 2021-07-29 ENCOUNTER — Other Ambulatory Visit: Payer: Self-pay | Admitting: Family Medicine

## 2021-07-29 DIAGNOSIS — F419 Anxiety disorder, unspecified: Secondary | ICD-10-CM

## 2021-08-14 ENCOUNTER — Encounter: Payer: Self-pay | Admitting: Cardiovascular Disease

## 2021-08-14 ENCOUNTER — Other Ambulatory Visit: Payer: Self-pay

## 2021-08-14 ENCOUNTER — Ambulatory Visit: Payer: BC Managed Care – PPO | Admitting: Cardiovascular Disease

## 2021-08-14 VITALS — BP 112/80 | HR 60 | Ht 70.0 in | Wt 226.1 lb

## 2021-08-14 DIAGNOSIS — I2510 Atherosclerotic heart disease of native coronary artery without angina pectoris: Secondary | ICD-10-CM | POA: Diagnosis not present

## 2021-08-14 DIAGNOSIS — E785 Hyperlipidemia, unspecified: Secondary | ICD-10-CM | POA: Diagnosis not present

## 2021-08-14 DIAGNOSIS — I493 Ventricular premature depolarization: Secondary | ICD-10-CM | POA: Diagnosis not present

## 2021-08-14 NOTE — Patient Instructions (Signed)

## 2021-08-14 NOTE — Progress Notes (Signed)
Cardiology Office Note   Date:  08/14/2021   ID:  Luis Ray, DOB [birth year], MRN [id number]  PCP:  Gwyneth Sprout, FNP  Cardiologist:   Kathlyn Sacramento, MD   Chief Complaint  Patient presents with   Other    12 month f/u no complaints today. Meds reviewed verbally with pt.      History of Present Illness: Her Luis Ray is a 56 y.o. male who is here today for follow-up visit regarding coronary atherosclerosis and symptomatic PVCs.   He does have family history of coronary artery disease.  His father had CABG at the age of 30.  Multiple members on his mother side had myocardial infarction in their 44s and 11s. He is not a smoker.  He had COVID-19 infection in November 2020 which did not require hospitalization.  After that, he had intermittent chest pain and palpitations.  CTA of the coronary arteries was performed in May 2021 which showed mild nonobstructive coronary artery disease.  Calcium score was 86.  His echocardiogram showed normal LV systolic function with no significant valvular abnormalities and no evidence of pulmonary hypertension. PVCs responded very well to diltiazem.    He has been doing very well with no recent chest pain, shortness of breath or palpitations.  No side effects with medications.  Past Medical History:  Diagnosis Date   Alopecia    History of 2019 novel coronavirus disease (COVID-19) 06/2019   Insomnia     Past Surgical History:  Procedure Laterality Date   COLONOSCOPY WITH PROPOFOL N/A 03/20/2020   Procedure: COLONOSCOPY WITH PROPOFOL;  Surgeon: Lucilla Lame, MD;  Location: ;  Service: Endoscopy;  Laterality: N/A;  priority 4   ROTATOR CUFF REPAIR Right      Current Outpatient Medications  Medication Sig Dispense Refill   aspirin EC 81 MG tablet Take 81 mg by mouth daily.     busPIRone (BUSPAR) 15 MG tablet TAKE 1 TABLET BY MOUTH TWICE A DAY 180 tablet 0   cetirizine (ZYRTEC) 10 MG tablet Take 10 mg by mouth daily.      diltiazem (CARDIZEM CD) 120 MG 24 hr capsule TAKE 1 CAPSULE BY MOUTH EVERY DAY 90 capsule 0   finasteride (PROSCAR) 5 MG tablet TAKE 1/4 TABLET BY MOUTH DAILY AS DIRECTED BY PHYSICIAN. 30 tablet 3   fluticasone (FLONASE) 50 MCG/ACT nasal spray SPRAY 2 SPRAYS INTO EACH NOSTRIL EVERY DAY 48 mL 0   meloxicam (MOBIC) 7.5 MG tablet TAKE 1 TABLET BY MOUTH EVERY DAY 30 tablet 1   rosuvastatin (CRESTOR) 20 MG tablet TAKE 1 TABLET BY MOUTH EVERY DAY 90 tablet 0   valACYclovir (VALTREX) 1000 MG tablet Take 1,000 mg by mouth 2 (two) times daily.     No current facility-administered medications for this visit.    Allergies:   Sulfa antibiotics    Social History:  The patient  reports that he has never smoked. He has never used smokeless tobacco. He reports current alcohol use of about 8.0 standard drinks per week. He reports that he does not use drugs.   Family History:  The patient's family history includes Coronary artery disease in his father.    ROS:  Please see the history of present illness.   Otherwise, review of systems are positive for none.   All other systems are reviewed and negative.    PHYSICAL EXAM: VS:  BP 112/80 (BP Location: Left Arm, Patient Position: Sitting, Cuff Size: Normal)  Pulse 60    Ht 5\' 10"  (1.778 m)    Wt 226 lb 2 oz (102.6 kg)    SpO2 97%    BMI 32.45 kg/m  , BMI Body mass index is 32.45 kg/m. GEN: Well nourished, well developed, in no acute distress  HEENT: normal  Neck: no JVD, carotid bruits, or masses Cardiac: RRR with premature beats; no murmurs, rubs, or gallops,no edema  Respiratory:  clear to auscultation bilaterally, normal work of breathing GI: soft, nontender, nondistended, + BS MS: no deformity or atrophy  Skin: warm and dry, no rash Neuro:  Strength and sensation are intact Psych: euthymic mood, full affect   EKG:  EKG is ordered today. The ekg ordered today demonstrates normal sinus rhythm with no significant ST or T wave changes.   Recent  Labs: 03/06/2021: ALT 15; BUN 17; Creatinine, Ser 1.30; Hemoglobin 15.0; Platelets 221; Potassium 4.4; Sodium 146; TSH 1.120    Lipid Panel    Component Value Date/Time   CHOL 148 03/06/2021 0917   TRIG 148 03/06/2021 0917   HDL 49 03/06/2021 0917   CHOLHDL 3.0 07/10/2020 0926   LDLCALC 73 03/06/2021 0917      Wt Readings from Last 3 Encounters:  08/14/21 226 lb 2 oz (102.6 kg)  03/06/21 219 lb (99.3 kg)  11/07/20 215 lb (97.5 kg)        No flowsheet data found.    ASSESSMENT AND PLAN:  1.  Mild coronary atherosclerosis: Currently with no anginal symptoms.  Continue low-dose aspirin and medical therapy.    2.  Symptomatic PVCs: Echocardiogram showed no evidence of cardiomyopathy or structural heart abnormalities.  He is doing very well with no evidence of recurrent PVCs by symptoms, exam or EKG.  I refilled his diltiazem.  3. Hyperlipidemia: LDL was 130 before starting rosuvastatin.  I reviewed most recent lipid profile done in August which showed an LDL of 73.  I refilled rosuvastatin.    Disposition:   FU with me in 12 months  Signed,  Kathlyn Sacramento, MD  08/14/2021 9:28 AM    [place]

## 2021-09-05 ENCOUNTER — Other Ambulatory Visit: Payer: Self-pay | Admitting: Family Medicine

## 2021-09-05 DIAGNOSIS — G8929 Other chronic pain: Secondary | ICD-10-CM

## 2021-09-05 DIAGNOSIS — M25562 Pain in left knee: Secondary | ICD-10-CM

## 2021-09-28 ENCOUNTER — Other Ambulatory Visit: Payer: Self-pay | Admitting: Cardiovascular Disease

## 2021-10-19 ENCOUNTER — Other Ambulatory Visit: Payer: Self-pay | Admitting: Family Medicine

## 2021-10-19 DIAGNOSIS — F419 Anxiety disorder, unspecified: Secondary | ICD-10-CM

## 2021-11-03 ENCOUNTER — Other Ambulatory Visit: Payer: Self-pay | Admitting: Family Medicine

## 2021-11-03 DIAGNOSIS — G8929 Other chronic pain: Secondary | ICD-10-CM

## 2021-11-18 ENCOUNTER — Other Ambulatory Visit: Payer: Self-pay | Admitting: Family Medicine

## 2021-11-18 DIAGNOSIS — F419 Anxiety disorder, unspecified: Secondary | ICD-10-CM

## 2021-12-10 ENCOUNTER — Ambulatory Visit: Payer: BC Managed Care – PPO | Admitting: Nurse Practitioner

## 2021-12-10 ENCOUNTER — Encounter: Payer: Self-pay | Admitting: Nurse Practitioner

## 2021-12-10 VITALS — HR 73 | Temp 97.2°F | Resp 17

## 2021-12-10 DIAGNOSIS — J0110 Acute frontal sinusitis, unspecified: Secondary | ICD-10-CM

## 2021-12-10 MED ORDER — AMOXICILLIN-POT CLAVULANATE 875-125 MG PO TABS: 1.0000 | ORAL_TABLET | Freq: Two times a day (BID) | ORAL | 0 refills | Status: AC

## 2021-12-10 NOTE — Progress Notes (Signed)
? ?  Subjective:  ? ? Patient ID: Vidyuth Belsito Sauseda, male    DOB: 18-Oct-1965, 56 y.o.   MRN: [id number] ? ?HPI ? ?56 year old male presenting to [facility] with complaints of sinus pressure and headache accompanied with sinus congestion for the past week. He now has a dry cough as well. He has been taking Claritin that he routinely alternates with Zyrtec. Flonase, and has added dayquil for the past week since congestion began.  ?He did take a COVID test at the start of his symptoms and that was negative.  ? ?Denies fever or chills ? ? ?Today's Vitals  ? 12/10/21 1006  ?Pulse: 73  ?Resp: 17  ?Temp: (!) 97.2 ?F (36.2 ?C)  ?TempSrc: Tympanic  ?SpO2: 98%  ? ?There is no height or weight on file to calculate BMI.  ? ?Review of Systems  ?Constitutional:  Positive for fatigue. Negative for chills and fever.  ?HENT:  Positive for congestion, sinus pressure and sinus pain.   ?Respiratory:  Positive for cough.   ?Cardiovascular: Negative.   ?Genitourinary: Negative.   ?Musculoskeletal: Negative.   ?Neurological: Negative.   ?Psychiatric/Behavioral: Negative.    ? ?   ?Objective:  ? Physical Exam ?HENT:  ?   Head: Normocephalic.  ?   Right Ear: Ear canal and external ear normal. A middle ear effusion is present. Tympanic membrane is not injected or bulging.  ?   Left Ear: Ear canal and external ear normal. A middle ear effusion is present. Tympanic membrane is not injected or bulging.  ?   Nose: Congestion present.  ?   Comments: Sinus pressure to maxillary and frontal regions bilaterally  ?   Mouth/Throat:  ?   Mouth: Mucous membranes are moist.  ?Eyes:  ?   Pupils: Pupils are equal, round, and reactive to light.  ?Cardiovascular:  ?   Rate and Rhythm: Normal rate and regular rhythm.  ?   Heart sounds: Normal heart sounds.  ?Pulmonary:  ?   Effort: Pulmonary effort is normal.  ?Musculoskeletal:  ?   Cervical back: Normal range of motion.  ?Skin: ?   General: Skin is warm.  ?Neurological:  ?   General: No focal  deficit present.  ?   Mental Status: He is alert.  ? ? ? ? ? ?   ?Assessment & Plan:  ? ?1. Acute non-recurrent frontal sinusitis ?Consider switching from dayquil to Mucinex OTC until congestion resolved ?Continue claritin and flonase  ? ?- amoxicillin-clavulanate (AUGMENTIN) 875-125 MG tablet; Take 1 tablet by mouth 2 (two) times daily for 7 days. Take with food  Dispense: 14 tablet; Refill: 0 ?   ?

## 2022-01-02 ENCOUNTER — Other Ambulatory Visit: Payer: Self-pay | Admitting: Family Medicine

## 2022-01-02 DIAGNOSIS — G8929 Other chronic pain: Secondary | ICD-10-CM

## 2022-01-04 NOTE — Telephone Encounter (Signed)
Requested Prescriptions  Pending Prescriptions Disp Refills  . meloxicam (MOBIC) 7.5 MG tablet [Pharmacy Med Name: MELOXICAM 7.5 MG TABLET] 30 tablet 1    Sig: TAKE 1 TABLET BY MOUTH EVERY DAY     Analgesics:  COX2 Inhibitors Failed - 01/02/2022  9:15 AM      Failed - Manual Review: Labs are only required if the patient has taken medication for more than 8 weeks.      Failed - Cr in normal range and within 360 days    Creatinine, Ser  Date Value Ref Range Status  03/06/2021 1.30 (H) 0.76 - 1.27 mg/dL Final         Passed - HGB in normal range and within 360 days    Hemoglobin  Date Value Ref Range Status  03/06/2021 15.0 13.0 - 17.7 g/dL Final         Passed - HCT in normal range and within 360 days    Hematocrit  Date Value Ref Range Status  03/06/2021 44.5 37.5 - 51.0 % Final         Passed - AST in normal range and within 360 days    AST  Date Value Ref Range Status  03/06/2021 13 0 - 40 IU/L Final         Passed - ALT in normal range and within 360 days    ALT  Date Value Ref Range Status  03/06/2021 15 0 - 44 IU/L Final         Passed - eGFR is 30 or above and within 360 days    GFR calc Af Amer  Date Value Ref Range Status  11/20/2019 79 >59 mL/min/1.73 Final   GFR calc non Af Amer  Date Value Ref Range Status  11/20/2019 68 >59 mL/min/1.73 Final   eGFR  Date Value Ref Range Status  03/06/2021 64 >59 mL/min/1.73 Final         Passed - Patient is not pregnant      Passed - Valid encounter within last 12 months    Recent Outpatient Visits          10 months ago Routine general medical examination at a health care facility   [facility] [place], [place]   1 year ago Annual physical exam   [place], [place], [place]   2 years ago Chest discomfort   [facility], [place], [place]   2 years ago History of 2019 novel coronavirus disease (COVID-19)   [facility], [place],  [place]   2 years ago Chronic anxiety   [place], [place], [place]      Future Appointments            In 2 months Gwyneth Sprout, [place], [place]

## 2022-02-18 ENCOUNTER — Other Ambulatory Visit: Payer: Self-pay | Admitting: Family Medicine

## 2022-02-18 DIAGNOSIS — F419 Anxiety disorder, unspecified: Secondary | ICD-10-CM

## 2022-03-08 ENCOUNTER — Encounter: Payer: Self-pay | Admitting: Family Medicine

## 2022-03-08 ENCOUNTER — Ambulatory Visit (INDEPENDENT_AMBULATORY_CARE_PROVIDER_SITE_OTHER): Payer: BC Managed Care – PPO | Admitting: Family Medicine

## 2022-03-08 VITALS — BP 114/83 | HR 61 | Temp 98.6°F | Resp 16 | Ht 70.0 in | Wt 225.0 lb

## 2022-03-08 DIAGNOSIS — F419 Anxiety disorder, unspecified: Secondary | ICD-10-CM

## 2022-03-08 DIAGNOSIS — Z125 Encounter for screening for malignant neoplasm of prostate: Secondary | ICD-10-CM | POA: Diagnosis not present

## 2022-03-08 DIAGNOSIS — M25562 Pain in left knee: Secondary | ICD-10-CM

## 2022-03-08 DIAGNOSIS — M25561 Pain in right knee: Secondary | ICD-10-CM

## 2022-03-08 DIAGNOSIS — L649 Androgenic alopecia, unspecified: Secondary | ICD-10-CM

## 2022-03-08 DIAGNOSIS — F5101 Primary insomnia: Secondary | ICD-10-CM | POA: Diagnosis not present

## 2022-03-08 DIAGNOSIS — Z0000 Encounter for general adult medical examination without abnormal findings: Secondary | ICD-10-CM | POA: Insufficient documentation

## 2022-03-08 DIAGNOSIS — Z1159 Encounter for screening for other viral diseases: Secondary | ICD-10-CM | POA: Diagnosis not present

## 2022-03-08 DIAGNOSIS — B009 Herpesviral infection, unspecified: Secondary | ICD-10-CM

## 2022-03-08 DIAGNOSIS — G8929 Other chronic pain: Secondary | ICD-10-CM

## 2022-03-08 MED ORDER — FINASTERIDE 5 MG PO TABS: ORAL_TABLET | ORAL | 3 refills | Status: DC

## 2022-03-08 MED ORDER — VALACYCLOVIR HCL 1 G PO TABS: ORAL_TABLET | ORAL | 0 refills | Status: DC

## 2022-03-08 MED ORDER — MELOXICAM 7.5 MG PO TABS: 7.5000 mg | ORAL_TABLET | Freq: Every day | ORAL | 3 refills | Status: DC

## 2022-03-08 MED ORDER — FINASTERIDE 5 MG PO TABS: 2.5000 mg | ORAL_TABLET | ORAL | 3 refills | Status: DC

## 2022-03-08 MED ORDER — ZALEPLON 5 MG PO CAPS: 5.0000 mg | ORAL_CAPSULE | Freq: Every evening | ORAL | 0 refills | Status: DC | PRN

## 2022-03-08 MED ORDER — BUSPIRONE HCL 15 MG PO TABS: 15.0000 mg | ORAL_TABLET | Freq: Two times a day (BID) | ORAL | 3 refills | Status: DC

## 2022-03-08 NOTE — Assessment & Plan Note (Signed)
Low risk screen Treatable, and curable. If left untreated Hep C can lead to cirrhosis and liver failure. Encourage routine testing; recommend repeat testing if risk factors change.  

## 2022-03-08 NOTE — Progress Notes (Signed)
Complete physical exam   Patient: Luis Ray   DOB: 11/22/1965   56 y.o. Male  MRN: [id number] Visit Date: 03/08/2022  Today's healthcare provider: Gwyneth Sprout, FNP  Introduced to nurse practitioner role and practice setting.  All questions answered.  Discussed provider/patient relationship and expectations.   I,Tiffany J Bragg,acting as a scribe for Gwyneth Sprout, FNP.,have documented all relevant documentation on the behalf of Gwyneth Sprout, FNP,as directed by  Gwyneth Sprout, FNP while in the presence of Gwyneth Sprout, FNP.   Chief Complaint  Patient presents with   Annual Exam   Subjective    Luis Ray is a 56 y.o. male who presents today for a complete physical exam.  He reports consuming a general diet. Exercising includes walking and yardwork He generally feels well. He reports sleeping poorly. He does not have additional problems to discuss today.  HPI    Past Medical History:  Diagnosis Date   Alopecia    History of 2019 novel coronavirus disease (COVID-19) 06/2019   Insomnia    Past Surgical History:  Procedure Laterality Date   COLONOSCOPY WITH PROPOFOL N/A 03/20/2020   Procedure: COLONOSCOPY WITH PROPOFOL;  Surgeon: Lucilla Lame, MD;  Location: [facility];  Service: Endoscopy;  Laterality: N/A;  priority 4   ROTATOR CUFF REPAIR Right    Social History   Socioeconomic History   Marital status: Married    Spouse name: Not on file   Number of children: Not on file   Years of education: Not on file   Highest education level: Not on file  Occupational History   Not on file  Tobacco Use   Smoking status: Never   Smokeless tobacco: Never  Vaping Use   Vaping Use: Never used  Substance and Sexual Activity   Alcohol use: Yes    Alcohol/week: 8.0 standard drinks of alcohol    Types: 8 Cans of beer per week    Comment: rare   Drug use: No   Sexual activity: Not on file  Other Topics Concern   Not on file  Social History Narrative    Not on file   Social Determinants of Health   Financial Resource Strain: Not on file  Food Insecurity: Not on file  Transportation Needs: Not on file  Physical Activity: Not on file  Stress: Not on file  Social Connections: Not on file  Intimate Partner Violence: Not on file   Family Status  Relation Name Status   Mother  Alive   Father  Alive   Mat Uncle  Deceased       heart attack   MGF  Deceased       heart attack   Family History  Problem Relation Age of Onset   Coronary artery disease Father    Allergies  Allergen Reactions   Sulfa Antibiotics Other (See Comments)    Upset stomach    Patient Care Team: Gwyneth Sprout, FNP as PCP - General (Family Medicine)   Medications: Outpatient Medications Prior to Visit  Medication Sig   aspirin EC 81 MG tablet Take 81 mg by mouth daily.   cetirizine (ZYRTEC) 10 MG tablet Take 10 mg by mouth daily.   diltiazem (CARDIZEM CD) 120 MG 24 hr capsule TAKE 1 CAPSULE BY MOUTH EVERY DAY   fluticasone (FLONASE) 50 MCG/ACT nasal spray SPRAY 2 SPRAYS INTO EACH NOSTRIL EVERY DAY   rosuvastatin (CRESTOR) 20 MG tablet TAKE 1  TABLET BY MOUTH EVERY DAY   [DISCONTINUED] busPIRone (BUSPAR) 15 MG tablet Take 1 tablet (15 mg total) by mouth 2 (two) times daily.   [DISCONTINUED] finasteride (PROSCAR) 5 MG tablet TAKE 1/4 TABLET BY MOUTH DAILY AS DIRECTED BY PHYSICIAN.   [DISCONTINUED] meloxicam (MOBIC) 7.5 MG tablet TAKE 1 TABLET BY MOUTH EVERY DAY   [DISCONTINUED] valACYclovir (VALTREX) 1000 MG tablet Take 1,000 mg by mouth 2 (two) times daily.   No facility-administered medications prior to visit.    Review of Systems    Objective     BP 114/83 (BP Location: Right Arm, Patient Position: Sitting, Cuff Size: Normal)   Pulse 61   Temp 98.6 F (37 C) (Oral)   Resp 16   Ht 5\' 10"  (1.778 m)   Wt 225 lb (102.1 kg)   SpO2 100%   BMI 32.28 kg/m      Physical Exam Vitals and nursing note reviewed.  Constitutional:      General: He  is awake. He is not in acute distress.    Appearance: Normal appearance. He is well-developed and well-groomed. He is obese. He is not ill-appearing, toxic-appearing or diaphoretic.  HENT:     Head: Normocephalic and atraumatic.     Jaw: There is normal jaw occlusion. No trismus, tenderness, swelling or pain on movement.     Salivary Glands: Right salivary gland is not diffusely enlarged or tender. Left salivary gland is not diffusely enlarged or tender.     Right Ear: Hearing, tympanic membrane, ear canal and external ear normal. There is no impacted cerumen.     Left Ear: Hearing, tympanic membrane, ear canal and external ear normal. There is no impacted cerumen.     Nose: Nose normal. No congestion or rhinorrhea.     Right Turbinates: Not enlarged, swollen or pale.     Left Turbinates: Not enlarged, swollen or pale.     Right Sinus: No maxillary sinus tenderness or frontal sinus tenderness.     Left Sinus: No maxillary sinus tenderness or frontal sinus tenderness.     Mouth/Throat:     Lips: Pink.     Mouth: Mucous membranes are moist. No injury, lacerations, oral lesions or angioedema.     Pharynx: Oropharynx is clear. Uvula midline. No pharyngeal swelling, oropharyngeal exudate or posterior oropharyngeal erythema.     Tonsils: No tonsillar exudate or tonsillar abscesses.  Eyes:     General: Lids are normal. Vision grossly intact. Gaze aligned appropriately.        Right eye: No discharge.        Left eye: No discharge.     Extraocular Movements: Extraocular movements intact.     Conjunctiva/sclera: Conjunctivae normal.     Pupils: Pupils are equal, round, and reactive to light.  Neck:     Thyroid: No thyroid mass, thyromegaly or thyroid tenderness.     Vascular: No carotid bruit.     Trachea: Trachea normal. No tracheal tenderness.  Cardiovascular:     Rate and Rhythm: Normal rate and regular rhythm.     Pulses: Normal pulses.          Carotid pulses are 2+ on the right side and  2+ on the left side.      Radial pulses are 2+ on the right side and 2+ on the left side.       Femoral pulses are 2+ on the right side and 2+ on the left side.      Popliteal pulses are  2+ on the right side and 2+ on the left side.       Dorsalis pedis pulses are 2+ on the right side and 2+ on the left side.       Posterior tibial pulses are 2+ on the right side and 2+ on the left side.     Heart sounds: Normal heart sounds, S1 normal and S2 normal. No murmur heard.    No friction rub. No gallop.  Pulmonary:     Effort: Pulmonary effort is normal. No respiratory distress.     Breath sounds: Normal breath sounds and air entry. No stridor. No wheezing, rhonchi or rales.  Chest:     Chest wall: No tenderness.  Abdominal:     General: Abdomen is flat. Bowel sounds are normal. There is no distension.     Palpations: Abdomen is soft. There is no mass.     Tenderness: There is no abdominal tenderness. There is no guarding or rebound.     Hernia: No hernia is present.  Genitourinary:    Comments: Exam deferred; endorses evening voids Musculoskeletal:        General: No swelling, tenderness, deformity or signs of injury. Normal range of motion.     Cervical back: Normal range of motion and neck supple. No rigidity or tenderness.     Right lower leg: No edema.     Left lower leg: No edema.  Lymphadenopathy:     Cervical: No cervical adenopathy.     Right cervical: No superficial, deep or posterior cervical adenopathy.    Left cervical: No superficial, deep or posterior cervical adenopathy.  Skin:    General: Skin is warm and dry.     Capillary Refill: Capillary refill takes less than 2 seconds.     Coloration: Skin is not jaundiced or pale.     Findings: No bruising, erythema, lesion or rash.  Neurological:     General: No focal deficit present.     Mental Status: He is alert and oriented to person, place, and time. Mental status is at baseline.     GCS: GCS eye subscore is 4. GCS verbal  subscore is 5. GCS motor subscore is 6.     Sensory: Sensation is intact. No sensory deficit.     Motor: Motor function is intact. No weakness.     Coordination: Coordination is intact.     Gait: Gait is intact.  Psychiatric:        Attention and Perception: Attention and perception normal.        Mood and Affect: Mood and affect normal.        Speech: Speech normal.        Behavior: Behavior normal. Behavior is cooperative.        Thought Content: Thought content normal.        Cognition and Memory: Cognition normal.        Judgment: Judgment normal.      Last depression screening scores    03/08/2022   10:16 AM 03/06/2021    9:01 AM 03/05/2020    3:20 PM  PHQ 2/9 Scores  PHQ - 2 Score 0 0 0  PHQ- 9 Score 1 0 0   Last fall risk screening    03/08/2022   10:16 AM  Fall Risk   Falls in the past year? 0  Number falls in past yr: 0  Injury with Fall? 0   Last Audit-C alcohol use screening    03/08/2022  10:17 AM  Alcohol Use Disorder Test (AUDIT)  1. How often do you have a drink containing alcohol? 2  2. How many drinks containing alcohol do you have on a typical day when you are drinking? 0  3. How often do you have six or more drinks on one occasion? 1  AUDIT-C Score 3   A score of 3 or more in women, and 4 or more in men indicates increased risk for alcohol abuse, EXCEPT if all of the points are from question 1   No results found for any visits on 03/08/22.  Assessment & Plan    Routine Health Maintenance and Physical Exam  Exercise Activities and Dietary recommendations  Goals   None     Immunization History  Administered Date(s) Administered   Influenza,inj,Quad PF,6+ Mos 08/19/2014, 08/31/2016, 09/16/2017, 06/22/2018   Tdap 09/05/2009, 12/26/2019    Health Maintenance  Topic Date Due   COVID-19 Vaccine (1) Never done   Hepatitis C Screening  Never done   Zoster Vaccines- Shingrix (1 of 2) Never done   INFLUENZA VACCINE  03/02/2022   COLONOSCOPY (Pts  45-75yrs Insurance coverage will need to be confirmed)  03/21/2027   TETANUS/TDAP  12/25/2029   HIV Screening  Completed   HPV VACCINES  Aged Out    Discussed health benefits of physical activity, and encouraged him to engage in regular exercise appropriate for his age and condition.  Problem List Items Addressed This Visit       Endocrine   Alopecia, male pattern    Chronic, stable Request for Rx refill      Relevant Medications   finasteride (PROSCAR) 5 MG tablet     Other   Annual physical exam - Primary    Discussed annual/bi-annual vision and dental screening Things to do to keep yourself healthy  - Exercise at least 30-45 minutes a day, 3-4 days a week.  - Eat a low-fat diet with lots of fruits and vegetables, up to 7-9 servings per day.  - Seatbelts can save your life. Wear them always.  - Smoke detectors on every level of your home, check batteries every year.  - Eye Doctor - have an eye exam every 1-2 years  - Safe sex - if you may be exposed to STDs, use a condom.  - Alcohol -  If you drink, do it moderately, less than 2 drinks per day.  - [place]. Choose someone to speak for you if you are not able.  - Depression is common in our stressful world.If you're feeling down or losing interest in things you normally enjoy, please come in for a visit.  - Violence - If anyone is threatening or hurting you, please call immediately.        Relevant Orders   Comprehensive Metabolic Panel (CMET)   CBC with Differential/Platelet   Lipid panel   TSH + free T4   PSA   Chronic anxiety    Chronic, stable Use of buspar 15 mg BID Denies use of recent benzos; some difficulty with sleep Previously used trazodone without improvement      Relevant Medications   busPIRone (BUSPAR) 15 MG tablet   Chronic pain of both knees    Chronic, stable Use of mobic 7.5 mg Discussed water intake and routine CMP for kidney function       Relevant Medications    meloxicam (MOBIC) 7.5 MG tablet   Encounter for hepatitis C screening test for low risk  patient    Low risk screen Treatable, and curable. If left untreated Hep C can lead to cirrhosis and liver failure. Encourage routine testing; recommend repeat testing if risk factors change.       Relevant Orders   Hepatitis C Antibody   Herpes    Chronic, stable HSV 1, cold sores Use of valtrex PRN      Relevant Medications   valACYclovir (VALTREX) 1000 MG tablet   Primary insomnia    Chronic, stable/worsening interferes with sleep nightly Trial of Sonata; after 1 month- reach out on continuing/titration Has CPAP; however, uncomfortable and often removes during sleep Does not want to see pulm at this time for eval of inspire device      Relevant Medications   zaleplon (SONATA) 5 MG capsule   Screening for prostate cancer    Check PSA; defer DRE Endorses night time waking for urination; some difficulty getting back to sleep following      Relevant Orders   PSA     Return in about 6 months (around 09/08/2022) for chonic disease management- if continue on sleep rx/sonata .     Vonna Kotyk, FNP, have reviewed all documentation for this visit. The documentation on 03/08/22 for the exam, diagnosis, procedures, and orders are all accurate and complete.    Gwyneth Sprout, [place] [phone] (phone) [phone] (fax)  [facility]

## 2022-03-08 NOTE — Assessment & Plan Note (Signed)
Discussed annual/bi-annual vision and dental screening Things to do to keep yourself healthy  - Exercise at least 30-45 minutes a day, 3-4 days a week.  - Eat a low-fat diet with lots of fruits and vegetables, up to 7-9 servings per day.  - Seatbelts can save your life. Wear them always.  - Smoke detectors on every level of your home, check batteries every year.  - Eye Doctor - have an eye exam every 1-2 years  - Safe sex - if you may be exposed to STDs, use a condom.  - Alcohol -  If you drink, do it moderately, less than 2 drinks per day.  - Health Care Power of Attorney. Choose someone to speak for you if you are not able.  - Depression is common in our stressful world.If you're feeling down or losing interest in things you normally enjoy, please come in for a visit.  - Violence - If anyone is threatening or hurting you, please call immediately.

## 2022-03-08 NOTE — Patient Instructions (Signed)
The CDC recommends two doses of Shingrix (the new shingles vaccine) separated by 2 to 6 months for adults age 57 years and older. I recommend checking with your insurance plan regarding coverage for this vaccine.    

## 2022-03-08 NOTE — Assessment & Plan Note (Signed)
Chronic, stable/worsening interferes with sleep nightly Trial of Sonata; after 1 month- reach out on continuing/titration Has CPAP; however, uncomfortable and often removes during sleep Does not want to see pulm at this time for eval of inspire device

## 2022-03-08 NOTE — Assessment & Plan Note (Signed)
Chronic, stable Request for Rx refill

## 2022-03-08 NOTE — Assessment & Plan Note (Signed)
Chronic, stable Use of buspar 15 mg BID Denies use of recent benzos; some difficulty with sleep Previously used trazodone without improvement

## 2022-03-08 NOTE — Assessment & Plan Note (Signed)
Chronic, stable Use of mobic 7.5 mg Discussed water intake and routine CMP for kidney function

## 2022-03-08 NOTE — Assessment & Plan Note (Signed)
Chronic, stable HSV 1, cold sores Use of valtrex PRN

## 2022-03-08 NOTE — Assessment & Plan Note (Signed)
Check PSA; defer DRE Endorses night time waking for urination; some difficulty getting back to sleep following

## 2022-03-09 LAB — TSH+FREE T4
Free T4: 0.95 ng/dL (ref 0.82–1.77)
TSH: 1.45 u[IU]/mL (ref 0.450–4.500)

## 2022-03-09 LAB — LIPID PANEL
Chol/HDL Ratio: 2.7 ratio (ref 0.0–5.0)
Cholesterol, Total: 149 mg/dL (ref 100–199)
HDL: 55 mg/dL (ref >39)
LDL Chol Calc (NIH): 80 mg/dL (ref 0–99)
Triglycerides: 69 mg/dL (ref 0–149)
VLDL Cholesterol Cal: 14 mg/dL (ref 5–40)

## 2022-03-09 LAB — PSA: Prostate Specific Ag, Serum: 0.3 ng/mL (ref 0.0–4.0)

## 2022-03-09 LAB — CBC WITH DIFFERENTIAL/PLATELET
Basophils Absolute: 0.1 10*3/uL (ref 0.0–0.2)
Basos: 1 %
EOS (ABSOLUTE): 0.1 10*3/uL (ref 0.0–0.4)
Eos: 2 %
Hematocrit: 45.6 % (ref 37.5–51.0)
Hemoglobin: 15.1 g/dL (ref 13.0–17.7)
Immature Grans (Abs): 0 10*3/uL (ref 0.0–0.1)
Immature Granulocytes: 1 %
Lymphocytes Absolute: 1.9 10*3/uL (ref 0.7–3.1)
Lymphs: 31 %
MCH: 29.8 pg (ref 26.6–33.0)
MCHC: 33.1 g/dL (ref 31.5–35.7)
MCV: 90 fL (ref 79–97)
Monocytes Absolute: 0.7 10*3/uL (ref 0.1–0.9)
Monocytes: 11 %
Neutrophils Absolute: 3.5 10*3/uL (ref 1.4–7.0)
Neutrophils: 54 %
Platelets: 249 10*3/uL (ref 150–450)
RBC: 5.07 x10E6/uL (ref 4.14–5.80)
RDW: 13.4 % (ref 11.6–15.4)
WBC: 6.4 10*3/uL (ref 3.4–10.8)

## 2022-03-09 LAB — COMPREHENSIVE METABOLIC PANEL
ALT: 23 IU/L (ref 0–44)
AST: 20 IU/L (ref 0–40)
Albumin/Globulin Ratio: 1.9 (ref 1.2–2.2)
Albumin: 4.8 g/dL (ref 3.8–4.9)
Alkaline Phosphatase: 77 IU/L (ref 44–121)
BUN/Creatinine Ratio: 14 (ref 9–20)
BUN: 20 mg/dL (ref 6–24)
Bilirubin Total: 0.3 mg/dL (ref 0.0–1.2)
CO2: 23 mmol/L (ref 20–29)
Calcium: 10 mg/dL (ref 8.7–10.2)
Chloride: 107 mmol/L — ABNORMAL HIGH (ref 96–106)
Creatinine, Ser: 1.39 mg/dL — ABNORMAL HIGH (ref 0.76–1.27)
Globulin, Total: 2.5 g/dL (ref 1.5–4.5)
Glucose: 92 mg/dL (ref 70–99)
Potassium: 4.7 mmol/L (ref 3.5–5.2)
Sodium: 143 mmol/L (ref 134–144)
Total Protein: 7.3 g/dL (ref 6.0–8.5)
eGFR: 59 mL/min/{1.73_m2} — ABNORMAL LOW (ref >59)

## 2022-03-09 LAB — HEPATITIS C ANTIBODY: Hep C Virus Ab: NONREACTIVE

## 2022-03-09 NOTE — Progress Notes (Signed)
Blood chemistry reveals elevated creatinine and decreased kidney function. Recommend increase water intake to 64 oz/day to assist and repeat labs in 1-2 months. If elevation remains, recommend follow up with kidney specialist.  All other labs are normal and stable.  Please let us know if you have any questions.  Thank you, Jacky Kindle, FNP  [facility] [address] [place], [place] [address] [phone] (phone) [phone] (fax) [facility]

## 2022-03-30 ENCOUNTER — Other Ambulatory Visit: Payer: Self-pay | Admitting: Family Medicine

## 2022-03-30 DIAGNOSIS — B009 Herpesviral infection, unspecified: Secondary | ICD-10-CM

## 2022-03-31 DIAGNOSIS — D2272 Melanocytic nevi of left lower limb, including hip: Secondary | ICD-10-CM | POA: Diagnosis not present

## 2022-03-31 DIAGNOSIS — D225 Melanocytic nevi of trunk: Secondary | ICD-10-CM | POA: Diagnosis not present

## 2022-03-31 DIAGNOSIS — L570 Actinic keratosis: Secondary | ICD-10-CM | POA: Diagnosis not present

## 2022-03-31 DIAGNOSIS — D2262 Melanocytic nevi of left upper limb, including shoulder: Secondary | ICD-10-CM | POA: Diagnosis not present

## 2022-03-31 DIAGNOSIS — D2261 Melanocytic nevi of right upper limb, including shoulder: Secondary | ICD-10-CM | POA: Diagnosis not present

## 2022-05-10 ENCOUNTER — Ambulatory Visit (INDEPENDENT_AMBULATORY_CARE_PROVIDER_SITE_OTHER): Payer: Self-pay | Admitting: Physician Assistant

## 2022-05-10 ENCOUNTER — Encounter: Payer: Self-pay | Admitting: Physician Assistant

## 2022-05-10 VITALS — BP 140/80 | HR 89 | Temp 99.7°F | Ht 70.0 in | Wt 218.6 lb

## 2022-05-10 DIAGNOSIS — J069 Acute upper respiratory infection, unspecified: Secondary | ICD-10-CM

## 2022-05-10 DIAGNOSIS — J309 Allergic rhinitis, unspecified: Secondary | ICD-10-CM

## 2022-05-10 DIAGNOSIS — J40 Bronchitis, not specified as acute or chronic: Secondary | ICD-10-CM

## 2022-05-10 LAB — POC COVID19 BINAXNOW: SARS Coronavirus 2 Ag: NEGATIVE

## 2022-05-10 MED ORDER — METHYLPREDNISOLONE 4 MG PO TBPK: ORAL_TABLET | ORAL | 0 refills | Status: DC

## 2022-05-10 MED ORDER — FLUTICASONE PROPIONATE 50 MCG/ACT NA SUSP: 2.0000 | Freq: Every day | NASAL | 6 refills | Status: DC

## 2022-05-10 NOTE — Progress Notes (Signed)
[facility] [address], [place] [address]   Office Visit Note  Patient Name: Luis Ray Date of Birth [id number]  Medical Record number [id number]  Date of Service: 05/10/2022  Chief Complaint  Patient presents with   Sinusitis    Started Sat. Coughing up green mucus. Has not done a COVID.      56 y/o M presents to the clinic for c/o post nasal drainage, cough with phlegm, nasal congestion, and minor sore throat x 2 days. Worse cough in AM and improves throughout the day. Denies fever, body aches, chills, CP, or SOB. Able to eat and drink without any difficulty. Has taken Dayquil with some relief.   Sinusitis Associated symptoms include congestion, coughing and a sore throat. Pertinent negatives include no ear pain, shortness of breath or sinus pressure.      Current Medication:  Outpatient Encounter Medications as of 05/10/2022  Medication Sig   aspirin EC 81 MG tablet Take 81 mg by mouth daily.   busPIRone (BUSPAR) 15 MG tablet Take 1 tablet (15 mg total) by mouth 2 (two) times daily.   cetirizine (ZYRTEC) 10 MG tablet Take 10 mg by mouth daily.   diltiazem (CARDIZEM CD) 120 MG 24 hr capsule TAKE 1 CAPSULE BY MOUTH EVERY DAY   finasteride (PROSCAR) 5 MG tablet 1.25 mg daily.   fluticasone (FLONASE) 50 MCG/ACT nasal spray Place 2 sprays into both nostrils daily.   meloxicam (MOBIC) 7.5 MG tablet Take 1 tablet (7.5 mg total) by mouth daily.   methylPREDNISolone (MEDROL DOSEPAK) 4 MG TBPK tablet Take as directed on the box   rosuvastatin (CRESTOR) 20 MG tablet TAKE 1 TABLET BY MOUTH EVERY DAY   valACYclovir (VALTREX) 1000 MG tablet For HSV flares; use twice daily for 7-10 days.   zaleplon (SONATA) 5 MG capsule Take 1 capsule (5 mg total) by mouth at bedtime as needed for sleep.   [DISCONTINUED] fluticasone (FLONASE) 50 MCG/ACT nasal spray SPRAY 2 SPRAYS INTO EACH NOSTRIL EVERY DAY   No facility-administered encounter medications on file as of 05/10/2022.       Medical History: Past Medical History:  Diagnosis Date   Alopecia    History of 2019 novel coronavirus disease (COVID-19) 06/2019   Insomnia      Vital Signs: BP (!) 140/80 (BP Location: Left Arm, Patient Position: Sitting, Cuff Size: Normal)   Pulse 89   Temp 99.7 F (37.6 C) (Tympanic)   Ht 5\' 10"  (1.778 m)   Wt 218 lb 9.6 oz (99.2 kg)   SpO2 98%   BMI 31.37 kg/m    Review of Systems  Constitutional: Negative.   HENT:  Positive for congestion, postnasal drip and sore throat. Negative for ear discharge, ear pain, rhinorrhea, sinus pressure, sinus pain and trouble swallowing.   Respiratory:  Positive for cough. Negative for choking, chest tightness, shortness of breath and wheezing.   Cardiovascular: Negative.   Neurological: Negative.     Physical Exam Constitutional:      Appearance: Normal appearance.  HENT:     Head: Atraumatic.     Right Ear: Tympanic membrane, ear canal and external ear normal.     Left Ear: Tympanic membrane, ear canal and external ear normal.     Nose: Congestion present. No rhinorrhea.     Mouth/Throat:     Mouth: Mucous membranes are moist.     Pharynx: Oropharynx is clear.  Eyes:     Extraocular Movements: Extraocular movements intact.  Cardiovascular:     Rate and Rhythm: Normal rate and regular rhythm.  Pulmonary:     Effort: Pulmonary effort is normal.     Breath sounds: Normal breath sounds.     Comments: Occasional cough during examination Musculoskeletal:     Cervical back: Neck supple.  Skin:    General: Skin is warm.  Neurological:     Mental Status: He is alert.  Psychiatric:        Mood and Affect: Mood normal.        Behavior: Behavior normal.        Thought Content: Thought content normal.        Judgment: Judgment normal.       Assessment/Plan: Viral URI         -POC Covid-19 BiaxinNow test Allergic Rhinitis, unspecified Bronchitis  Reviewed negative covid test result with patient. Increase  fluids. Start medicines as prescribed. Continue to watch for worsening symptoms. If symptoms don't improve then patient will contact us for further evaluation and at this time I may consider an oral antibiotic if appropriate. RTC if any difficulty arises  General Counseling: Bakari verbalizes understanding of the findings of todays visit and agrees with plan of treatment. I have discussed any further diagnostic evaluation that may be needed or ordered today. We also reviewed his medications today. he has been encouraged to call the office with any questions or concerns that should arise related to todays visit.    Time spent:20 Minutes    Gilberto Better, [place] Physician Assistant

## 2022-05-17 ENCOUNTER — Ambulatory Visit (INDEPENDENT_AMBULATORY_CARE_PROVIDER_SITE_OTHER): Payer: Self-pay | Admitting: Physician Assistant

## 2022-05-17 ENCOUNTER — Encounter: Payer: Self-pay | Admitting: Physician Assistant

## 2022-05-17 VITALS — BP 124/80 | HR 74 | Temp 98.4°F

## 2022-05-17 DIAGNOSIS — J329 Chronic sinusitis, unspecified: Secondary | ICD-10-CM

## 2022-05-17 MED ORDER — AMOXICILLIN-POT CLAVULANATE 875-125 MG PO TABS: 1.0000 | ORAL_TABLET | Freq: Two times a day (BID) | ORAL | 0 refills | Status: AC

## 2022-05-17 NOTE — Progress Notes (Signed)
[facility] [address], [place] [address]   Office Visit Note  Patient Name: Luis Ray Date of Birth [id number]  Medical Record number [id number]  Date of Service: 05/17/2022  Chief Complaint  Patient presents with   Follow-up    Still have congestion, fells like has a "marshmallow" in left ear. Still has pressure behind eyes.      56 y/o M presents to the clinic for c/o left ear pain, sinus pressure and pain as well as occasional cough. He was seen last week for the same and was started on Medrol dosepak. Felt better, but he continues to have the pressure and new onset of left ear pain. Has been using Sudafed and flonase nasal spray. He denies CP, SOB, wheezing, fever, body aches.       Current Medication:  Outpatient Encounter Medications as of 05/17/2022  Medication Sig   amoxicillin-clavulanate (AUGMENTIN) 875-125 MG tablet Take 1 tablet by mouth 2 (two) times daily for 7 days.   aspirin EC 81 MG tablet Take 81 mg by mouth daily.   busPIRone (BUSPAR) 15 MG tablet Take 1 tablet (15 mg total) by mouth 2 (two) times daily.   cetirizine (ZYRTEC) 10 MG tablet Take 10 mg by mouth daily.   diltiazem (CARDIZEM CD) 120 MG 24 hr capsule TAKE 1 CAPSULE BY MOUTH EVERY DAY   finasteride (PROSCAR) 5 MG tablet 1.25 mg daily.   fluticasone (FLONASE) 50 MCG/ACT nasal spray Place 2 sprays into both nostrils daily.   meloxicam (MOBIC) 7.5 MG tablet Take 1 tablet (7.5 mg total) by mouth daily.   methylPREDNISolone (MEDROL DOSEPAK) 4 MG TBPK tablet Take as directed on the box   rosuvastatin (CRESTOR) 20 MG tablet TAKE 1 TABLET BY MOUTH EVERY DAY   valACYclovir (VALTREX) 1000 MG tablet For HSV flares; use twice daily for 7-10 days.   zaleplon (SONATA) 5 MG capsule Take 1 capsule (5 mg total) by mouth at bedtime as needed for sleep.   No facility-administered encounter medications on file as of 05/17/2022.      Medical History: Past Medical History:  Diagnosis Date    Alopecia    History of 2019 novel coronavirus disease (COVID-19) 06/2019   Insomnia      Vital Signs: BP 124/80 (BP Location: Left Arm, Patient Position: Sitting, Cuff Size: Normal)   Pulse 74   Temp 98.4 F (36.9 C) (Tympanic)   SpO2 97%    Review of Systems  Constitutional: Negative.   HENT:  Positive for congestion, ear pain (left ear only), postnasal drip, sinus pressure and sinus pain. Negative for ear discharge, sore throat and trouble swallowing.   Respiratory:  Positive for cough (occasional). Negative for chest tightness, shortness of breath and wheezing.   Cardiovascular: Negative.   Neurological: Negative.     Physical Exam Constitutional:      Appearance: Normal appearance.  HENT:     Head: Atraumatic.     Right Ear: Tympanic membrane, ear canal and external ear normal.     Left Ear: Ear canal and external ear normal. Tympanic membrane is erythematous. Tympanic membrane is not perforated.     Ears:     Comments:  R and L TM appears to have air-fluid pockets, but L TM appears erythematous without any serous drainage     Nose: Nose normal.     Mouth/Throat:     Mouth: Mucous membranes are moist.     Pharynx: Oropharynx is clear.  Eyes:  Extraocular Movements: Extraocular movements intact.  Cardiovascular:     Rate and Rhythm: Normal rate and regular rhythm.  Pulmonary:     Effort: Pulmonary effort is normal.     Breath sounds: Normal breath sounds.  Musculoskeletal:     Cervical back: Neck supple.  Skin:    General: Skin is warm.  Neurological:     Mental Status: He is alert.  Psychiatric:        Mood and Affect: Mood normal.        Behavior: Behavior normal.        Thought Content: Thought content normal.        Judgment: Judgment normal.       Assessment/Plan:  1. Rhinosinusitis - amoxicillin-clavulanate (AUGMENTIN) 875-125 MG tablet; Take 1 tablet by mouth 2 (two) times daily for 7 days.  Dispense: 14 tablet; Refill: 0  Increase  fluids Continue to take Sudafed and Flonase nasal spray Use the humidifier Start a new medicine as prescribe, Augmentin.  If symptoms don't improve then RTC prn. Pt verbalized understanding and in agreement.  Today's visit is a 20 minute F2F encounter.   General Counseling: aria pickrell understanding of the findings of todays visit and agrees with plan of treatment. I have discussed any further diagnostic evaluation that may be needed or ordered today. We also reviewed his medications today. he has been encouraged to call the office with any questions or concerns that should arise related to todays visit.    Time spent:[address] [place], [place] Physician Assistant

## 2022-05-17 NOTE — Progress Notes (Signed)
[place]

## 2022-06-01 ENCOUNTER — Ambulatory Visit (INDEPENDENT_AMBULATORY_CARE_PROVIDER_SITE_OTHER): Payer: Self-pay | Admitting: Physician Assistant

## 2022-06-01 DIAGNOSIS — J329 Chronic sinusitis, unspecified: Secondary | ICD-10-CM

## 2022-06-01 MED ORDER — DOXYCYCLINE HYCLATE 100 MG PO TABS: 100.0000 mg | ORAL_TABLET | Freq: Two times a day (BID) | ORAL | 0 refills | Status: DC

## 2022-06-01 NOTE — Progress Notes (Signed)
Virtual Visit Consent   Youcef Klas Raudenbush, you are scheduled for a virtual visit with a [facility] provider today. Just as with appointments in the office, your consent must be obtained to participate. Your consent will be active for this visit and any virtual visit you may have with one of our providers in the next 365 days. If you have a MyChart account, a copy of this consent can be sent to you electronically.  As this is a virtual telephone visit which doesn't allow your provider to perform a traditional examination and may limit your provider's ability to fully assess your condition. If your provider identifies any concerns that need to be evaluated in person or the need to arrange testing (such as labs, EKG, etc.), we will make arrangements to do so. Although advances in technology are sophisticated, we cannot ensure that it will always work on either your end or our end. If the connection with a video visit is poor, the visit may have to be switched to a telephone visit. With either a video or telephone visit, we are not always able to ensure that we have a secure connection.  By engaging in this virtual visit, you consent to the provision of healthcare and authorize for your insurance to be billed (if applicable) for the services provided during this visit. Depending on your insurance coverage, you may receive a charge related to this service.  I need to obtain your verbal consent now. Are you willing to proceed with your visit today? Zenon L Gendron has provided verbal consent on 06/01/2022 for a virtual visit (video or telephone). Gilberto Better, [place]  Date: 06/01/2022 8:51 AM  Virtual Visit via Telephone Note   I, Gilberto Better, connected with  Zackeriah Kissler [place]  ([id number], 02/05/1966) on 06/01/22 at  8:30 AM EDT by a telephone and verified that I am speaking with the correct person using two identifiers.  Location: Patient: Virtual Visit Location Patient: Home Provider: Virtual Visit Location  Provider: Office/Clinic   I discussed the limitations of evaluation and management by telemedicine and the availability of in person appointments. The patient expressed understanding and agreed to proceed.    History of Present Illness: Luis Ray is a 56 y.o. who identifies as a male who was assigned male at birth, and is being seen today for sinus pain, pressure, headache, nasal congestion, fatigue, myalgias.  HPI: HPI  Problems:  Patient Active Problem List   Diagnosis Date Noted   Annual physical exam 03/08/2022   Screening for prostate cancer 03/08/2022   Primary insomnia 03/08/2022   Encounter for hepatitis C screening test for low risk patient 03/08/2022   Routine general medical examination at a health care facility 03/06/2021   Chronic pain of both knees 03/06/2021   Pain of both shoulder joints 03/06/2021   Personal history of colonic polyps    GERD (gastroesophageal reflux disease) 08/31/2016   Hyperglycemia 09/16/2015   Chronic anxiety 10/10/2008   Herpes 08/02/2004   Allergic rhinitis 08/02/2002   Hypercholesterolemia without hypertriglyceridemia 02/07/2001   Alopecia, male pattern 08/02/2000    Allergies:  Allergies  Allergen Reactions   Sulfa Antibiotics Other (See Comments)    Upset stomach   Medications:  Current Outpatient Medications:    doxycycline (VIBRA-TABS) 100 MG tablet, Take 1 tablet (100 mg total) by mouth 2 (two) times daily., Disp: 14 tablet, Rfl: 0   aspirin EC 81 MG tablet, Take 81 mg by mouth daily., Disp: , Rfl:  busPIRone (BUSPAR) 15 MG tablet, Take 1 tablet (15 mg total) by mouth 2 (two) times daily., Disp: 180 tablet, Rfl: 3   cetirizine (ZYRTEC) 10 MG tablet, Take 10 mg by mouth daily., Disp: , Rfl:    diltiazem (CARDIZEM CD) 120 MG 24 hr capsule, TAKE 1 CAPSULE BY MOUTH EVERY DAY, Disp: 90 capsule, Rfl: 2   finasteride (PROSCAR) 5 MG tablet, 1.25 mg daily., Disp: 25 tablet, Rfl: 3   fluticasone (FLONASE) 50 MCG/ACT nasal spray, Place  2 sprays into both nostrils daily., Disp: 16 g, Rfl: 6   meloxicam (MOBIC) 7.5 MG tablet, Take 1 tablet (7.5 mg total) by mouth daily., Disp: 90 tablet, Rfl: 3   methylPREDNISolone (MEDROL DOSEPAK) 4 MG TBPK tablet, Take as directed on the box, Disp: 1 each, Rfl: 0   rosuvastatin (CRESTOR) 20 MG tablet, TAKE 1 TABLET BY MOUTH EVERY DAY, Disp: 90 tablet, Rfl: 2   valACYclovir (VALTREX) 1000 MG tablet, For HSV flares; use twice daily for 7-10 days., Disp: 90 tablet, Rfl: 0   zaleplon (SONATA) 5 MG capsule, Take 1 capsule (5 mg total) by mouth at bedtime as needed for sleep., Disp: 30 capsule, Rfl: 0  Observations/Objective: Patient is well-developed, well-nourished in no acute distress.  Resting comfortably at home.  Head is normocephalic, atraumatic.  No labored breathing.  Speech is clear and coherent with logical content.  Patient is alert and oriented at baseline.    Assessment and Plan: 1. Rhinosinusitis - doxycycline (VIBRA-TABS) 100 MG tablet; Take 1 tablet (100 mg total) by mouth 2 (two) times daily.  Dispense: 14 tablet; Refill: 0  Increase fluids Humidifier Take otc Flonase nasal spray, oral anti-histamine, and re-start oral decongestant as directed on the box. IF symptoms don't improve then start Doxycyline as prescribed. RTC prn Pt verbalized understanding and in agreement.    Follow Up Instructions: I discussed the assessment and treatment plan with the patient. The patient was provided an opportunity to ask questions and all were answered. The patient agreed with the plan and demonstrated an understanding of the instructions.  A copy of instructions were sent to the patient via MyChart unless otherwise noted below.    The patient was advised to call back or seek an in-person evaluation if the symptoms worsen or if the condition fails to improve as anticipated.  Time:  I spent 10 minutes with the patient via telehealth technology discussing the above problems/concerns.     Johnna Acosta, PA-C

## 2022-08-04 ENCOUNTER — Other Ambulatory Visit: Payer: Self-pay | Admitting: Cardiovascular Disease

## 2022-08-04 NOTE — Telephone Encounter (Signed)
Please schedule 1 year follow up, last seen 08-14-21.  Medication refill pending appt. Thank you.

## 2022-08-04 NOTE — Telephone Encounter (Signed)
Please contact pt for future appointment. ?Pt due for 12 month f/u. ?Pt needing refills. ?

## 2022-08-05 NOTE — Telephone Encounter (Signed)
Patient coming in tomorrow.

## 2022-08-05 NOTE — Progress Notes (Signed)
Cardiology Office Note    Date:  08/06/2022   ID:  Luis Ray, DOB 11/18/1965, MRN [id number]  PCP:  Jacky Kindle, FNP  Cardiologist:  Lorine Bears, MD  Electrophysiologist:  None   Chief Complaint: Follow-up  History of Present Illness:   Luis Ray is a 57 y.o. male with history of CAD, symptomatic PVCs, HLD, and family history of CAD with his father having a CABG at age 77 and multiple family members on his mother's side having MIs in their 65s and 56s who presents for follow-up.  ETT in 01/2015 was normal with good exercise tolerance, achieving 12.2 METs.  He did have occasional PVCs that started in recovery and resolved after 3 minutes.  He had COVID in 06/2019 and did not require hospitalization.  Following that, he had intermittent chest pain and palpitations.  Coronary CTA in 12/2019 showed a calcium score of 86 which was the 77th percentile.  There was mild nonobstructive CAD.  Echo showed normal LV systolic function with no significant valvular abnormalities or pulmonary hypertension.  He was last seen in the office in 08/2021 and was without symptoms of angina or decompensation.  He comes in doing well from a cardiac perspective and is without symptoms of angina or decompensation.  No dyspnea, palpitations, dizziness, presyncope, or syncope.  No falls or symptoms concerning for bleeding.  No progressive orthopnea or lower extremity swelling/abdominal distention.  He continues to live an active lifestyle and eats a heart healthy diet.  He is hopeful to get his weight under 200 pounds.  He does not smoke.  Tolerating Cardizem and Crestor without issues.   Labs independently reviewed: 03/2022 - TSH normal, TC 149, TG 69, HDL 55, LDL 80, Hgb 15.1, PLT 249, BUN 20, serum creatinine 1.39, potassium 4.7, albumin 4.8, AST/ALT normal 11/2019 - magnesium 2.1  Past Medical History:  Diagnosis Date   Alopecia    History of 2019 novel coronavirus disease (COVID-19) 06/2019   Insomnia      Past Surgical History:  Procedure Laterality Date   COLONOSCOPY WITH PROPOFOL N/A 03/20/2020   Procedure: COLONOSCOPY WITH PROPOFOL;  Surgeon: Midge Minium, MD;  [facility];  Service: Endoscopy;  Laterality: N/A;  priority 4   ROTATOR CUFF REPAIR Right     Current Medications: Current Meds  Medication Sig   aspirin EC 81 MG tablet Take 81 mg by mouth daily.   busPIRone (BUSPAR) 15 MG tablet Take 1 tablet (15 mg total) by mouth 2 (two) times daily.   cetirizine (ZYRTEC) 10 MG tablet Take 10 mg by mouth daily.   finasteride (PROSCAR) 5 MG tablet 1.25 mg daily.   fluticasone (FLONASE) 50 MCG/ACT nasal spray Place 2 sprays into both nostrils daily.   meloxicam (MOBIC) 7.5 MG tablet Take 1 tablet (7.5 mg total) by mouth daily.   rosuvastatin (CRESTOR) 40 MG tablet Take 1 tablet (40 mg total) by mouth daily.   valACYclovir (VALTREX) 1000 MG tablet For HSV flares; use twice daily for 7-10 days.   [DISCONTINUED] diltiazem (CARDIZEM CD) 120 MG 24 hr capsule TAKE 1 CAPSULE BY MOUTH EVERY DAY   [DISCONTINUED] rosuvastatin (CRESTOR) 20 MG tablet Take 1 tablet (20 mg total) by mouth daily. Pt needs to make appt with provider for further refills - 1st attempt    Allergies:   Sulfa antibiotics   Social History   Socioeconomic History   Marital status: Married    Spouse name: Not on file  Number of children: Not on file   Years of education: Not on file   Highest education level: Not on file  Occupational History   Not on file  Tobacco Use   Smoking status: Never   Smokeless tobacco: Never  Vaping Use   Vaping Use: Never used  Substance and Sexual Activity   Alcohol use: Yes    Alcohol/week: 8.0 standard drinks of alcohol    Types: 8 Cans of beer per week    Comment: rare   Drug use: No   Sexual activity: Not on file  Other Topics Concern   Not on file  Social History Narrative   Not on file   Social Determinants of Health   Financial Resource Strain:  Not on file  Food Insecurity: Not on file  Transportation Needs: Not on file  Physical Activity: Not on file  Stress: Not on file  Social Connections: Not on file     Family History:  The patient's family history includes Coronary artery disease in his father.  ROS:   12-point review of systems is negative unless otherwise noted in HPI.   EKGs/Labs/Other Studies Reviewed:    Studies reviewed were summarized above. The additional studies were reviewed today:  Coronary CTA 12/18/2019: Coronary calcium score: The patient's coronary artery calcium score is 86, which places the patient in the 77th percentile.   Coronary arteries: Normal coronary origins.  Right dominance.   Right Coronary Artery: Dominant vessel which gives rise to R-PDA and R-PLB branches. Punctate area of proximal calcification without significant stenosis, otherwise normal.   Left Main Coronary Artery: Long vessel. Minimal punctate distal calcification without significant stenosis. Bifurcates into the LAD and LCx arteries. There are 2 moderate diagonal branches.   Left Anterior Descending Coronary Artery: Extends around the apex. There is mild 25-49% mixed proximal stenosis (CADRADS2) after the high D2 vessel. The distal LAD is free of disease. The D1 branch is normal. The D2 branch has a mild 1-24% mixed proximal stenosis (CADRADS1), however, this vessel is <2.0 mm.   Left Circumflex Artery: AV groove vessel. Minimal mid-vessel punctate calcification without stenosis. There is a small mid-vessel OM branch without stenosis.   Aorta: Normal size, 26 mm at the mid ascending aorta (level of the PA bifurcation) measured double oblique. No calcifications. No dissection.   Aortic Valve: Trileaflet.  No calcifications.   Other findings:   Normal pulmonary vein drainage into the left atrium.   Normal left atrial appendage without a thrombus.   Normal size of the pulmonary artery.   IMPRESSION: 1. Mild  mixed non-obstructive CAD of the proximal LAD, CADRADS = 2. 2. Coronary calcium score of 86. This was 77th percentile for age and sex matched control. Calcium is predominantly in the LAD. 3. Normal coronary origin with right dominance. __________  2D echo 12/10/2019: 1. Left ventricular ejection fraction, by estimation, is 60 to 65%. The  left ventricle has normal function. The left ventricle has no regional  wall motion abnormalities. The left ventricular internal cavity size was  mildly dilated. Left ventricular  diastolic parameters were normal.   2. Right ventricular systolic function is normal. The right ventricular  size is normal. There is normal pulmonary artery systolic pressure.   3. Left atrial size was mildly dilated.   4. The mitral valve is normal in structure. Trivial mitral valve  regurgitation. No evidence of mitral stenosis.   5. The aortic valve is normal in structure. Aortic valve regurgitation is  not  visualized. No aortic stenosis is present.   6. The inferior vena cava is normal in size with greater than 50%  respiratory variability, suggesting right atrial pressure of 3 mmHg.  __________  ETT 02/13/2015: Normal exercise treadmill study No ST changes concerning for ischemia at peak stress or in recovery Good exercise tolerance He achieved 12.2 METS, target heart rate 166 bpm which was 97% of his maximum predicted heart rate Peak blood pressure 217/77 Exercise time 10 minutes 17 seconds Of note he did have PVCs, occasional, that started in recovery and resolved after 3 minutes    EKG:  EKG is ordered today.  The EKG ordered today demonstrates NSR, 62 bpm, no acute ST-T changes  Recent Labs: 03/08/2022: ALT 23; BUN 20; Creatinine, Ser 1.39; Hemoglobin 15.1; Platelets 249; Potassium 4.7; Sodium 143; TSH 1.450  Recent Lipid Panel    Component Value Date/Time   CHOL 149 03/08/2022 1038   TRIG 69 03/08/2022 1038   HDL 55 03/08/2022 1038   CHOLHDL 2.7  03/08/2022 1038   LDLCALC 80 03/08/2022 1038    PHYSICAL EXAM:    VS:  BP 110/70 (BP Location: Left Arm, Patient Position: Sitting, Cuff Size: Large)   Pulse 62   Ht 5\' 10"  (1.778 m)   Wt 224 lb 4 oz (101.7 kg)   SpO2 98%   BMI 32.18 kg/m   BMI: Body mass index is 32.18 kg/m.  Physical Exam Vitals reviewed.  Constitutional:      Appearance: He is well-developed.  HENT:     Head: Normocephalic and atraumatic.  Eyes:     General:        Right eye: No discharge.        Left eye: No discharge.  Neck:     Vascular: No JVD.  Cardiovascular:     Rate and Rhythm: Normal rate and regular rhythm.     Heart sounds: Normal heart sounds, S1 normal and S2 normal. Heart sounds not distant. No midsystolic click and no opening snap. No murmur heard.    No friction rub.  Pulmonary:     Effort: Pulmonary effort is normal. No respiratory distress.     Breath sounds: Normal breath sounds. No decreased breath sounds, wheezing or rales.  Chest:     Chest wall: No tenderness.  Abdominal:     General: There is no distension.  Musculoskeletal:     Cervical back: Normal range of motion.  Skin:    General: Skin is warm and dry.     Nails: There is no clubbing.  Neurological:     Mental Status: He is alert and oriented to person, place, and time.  Psychiatric:        Speech: Speech normal.        Behavior: Behavior normal.        Thought Content: Thought content normal.        Judgment: Judgment normal.     Wt Readings from Last 3 Encounters:  08/06/22 224 lb 4 oz (101.7 kg)  05/10/22 218 lb 9.6 oz (99.2 kg)  03/08/22 225 lb (102.1 kg)     ASSESSMENT & PLAN:   Nonobstructive CAD: No symptoms suggestive of angina or decompensation.  He remains on aspirin and rosuvastatin.  No indication for further ischemic testing at this time.  Symptomatic PVCs: Quiescent on Cardizem CD which will be continued.  HLD: LDL 80 in 03/2022 with normal AST/ALT at that time.  Goal LDL less than 70.   Titrate rosuvastatin  to 40 mg daily.  Follow-up fasting lipid panel and LFT in 3 months.   Disposition: F/u with Dr. Fletcher Anon or an APP in 12 months.   Medication Adjustments/Labs and Tests Ordered: Current medicines are reviewed at length with the patient today.  Concerns regarding medicines are outlined above. Medication changes, Labs and Tests ordered today are summarized above and listed in the Patient Instructions accessible in Encounters.   Signed, Christell Faith, PA-C 08/06/2022 2:04 PM     [address] [place], [place] [address] [phone]

## 2022-08-06 ENCOUNTER — Ambulatory Visit: Payer: BC Managed Care – PPO | Attending: Physician Assistant | Admitting: Physician Assistant

## 2022-08-06 ENCOUNTER — Encounter: Payer: Self-pay | Admitting: Physician Assistant

## 2022-08-06 VITALS — BP 110/70 | HR 62 | Ht 70.0 in | Wt 224.2 lb

## 2022-08-06 DIAGNOSIS — E785 Hyperlipidemia, unspecified: Secondary | ICD-10-CM

## 2022-08-06 DIAGNOSIS — I2510 Atherosclerotic heart disease of native coronary artery without angina pectoris: Secondary | ICD-10-CM | POA: Diagnosis not present

## 2022-08-06 DIAGNOSIS — I493 Ventricular premature depolarization: Secondary | ICD-10-CM | POA: Diagnosis not present

## 2022-08-06 MED ORDER — DILTIAZEM HCL ER COATED BEADS 120 MG PO CP24: ORAL_CAPSULE | ORAL | 3 refills | Status: DC

## 2022-08-06 MED ORDER — ROSUVASTATIN CALCIUM 40 MG PO TABS: 40.0000 mg | ORAL_TABLET | Freq: Every day | ORAL | 3 refills | Status: DC

## 2022-08-06 NOTE — Patient Instructions (Signed)
Medication Instructions:  Your physician has recommended you make the following change in your medication:   INCREASE Crestor to 40 mg once daily   *If you need a refill on your cardiac medications before your next appointment, please call your pharmacy*   Lab Work: Fasting Lipid & Liver panel to be done in 3 months. Nothing to eat or drink after midnight before except sip of water with your medications. No appointment is needed and just go to the following location: [place] at [facility] 1st desk on the right to check in (REGISTRATION)  Lab hours: Monday- Friday (7:30 am- 5:30 pm)  If you have labs (blood work) drawn today and your tests are completely normal, you will receive your results only by: MyChart Message (if you have MyChart) OR A paper copy in the mail If you have any lab test that is abnormal or we need to change your treatment, we will call you to review the results.   Testing/Procedures: None   Follow-Up: At [facility], you and your health needs are our priority.  As part of our continuing mission to provide you with exceptional heart care, we have created designated Provider Care Teams.  These Care Teams include your primary Cardiologist (physician) and Advanced Practice Providers (APPs -  Physician Assistants and Nurse Practitioners) who all work together to provide you with the care you need, when you need it.  Your next appointment:   1 year(s)  The format for your next appointment:   In Person  Provider:   Kathlyn Sacramento, MD or Christell Faith, PA-C       Important Information About [place]

## 2022-11-05 ENCOUNTER — Other Ambulatory Visit
Admission: RE | Admit: 2022-11-05 | Discharge: 2022-11-05 | Disposition: A | Discharge status: Home / Self Care | Payer: BC Managed Care – PPO | Source: Ambulatory Visit | Attending: Physician Assistant | Admitting: Physician Assistant

## 2022-11-05 DIAGNOSIS — I2510 Atherosclerotic heart disease of native coronary artery without angina pectoris: Secondary | ICD-10-CM | POA: Insufficient documentation

## 2022-11-05 DIAGNOSIS — I493 Ventricular premature depolarization: Secondary | ICD-10-CM | POA: Diagnosis not present

## 2022-11-05 DIAGNOSIS — E785 Hyperlipidemia, unspecified: Secondary | ICD-10-CM

## 2022-11-05 LAB — LIPID PANEL
Cholesterol: 131 mg/dL (ref 0–200)
HDL: 43 mg/dL (ref >40)
LDL Cholesterol: 67 mg/dL (ref 0–99)
Total CHOL/HDL Ratio: 3 RATIO
Triglycerides: 103 mg/dL (ref <150)
VLDL: 21 mg/dL (ref 0–40)

## 2022-11-05 LAB — HEPATIC FUNCTION PANEL
ALT: 22 U/L (ref 0–44)
AST: 20 U/L (ref 15–41)
Albumin: 4.1 g/dL (ref 3.5–5.0)
Alkaline Phosphatase: 56 U/L (ref 38–126)
Bilirubin, Direct: <0.1 mg/dL (ref 0.0–0.2)
Total Bilirubin: 0.6 mg/dL (ref 0.3–1.2)
Total Protein: 7.1 g/dL (ref 6.5–8.1)

## 2023-03-11 ENCOUNTER — Other Ambulatory Visit: Payer: Self-pay | Admitting: Family Medicine

## 2023-03-11 DIAGNOSIS — F419 Anxiety disorder, unspecified: Secondary | ICD-10-CM

## 2023-03-11 NOTE — Telephone Encounter (Signed)
OV needed for additional refills. 30 day supply given until OV can be made.  Requested Prescriptions  Pending Prescriptions Disp Refills   busPIRone (BUSPAR) 15 MG tablet [Pharmacy Med Name: BUSPIRONE HCL 15 MG TABLET] 60 tablet 0    Sig: TAKE 1 TABLET BY MOUTH 2 TIMES DAILY.     Psychiatry: Anxiolytics/Hypnotics - Non-controlled Failed - 03/11/2023  2:25 AM      Failed - Valid encounter within last 12 months    Recent Outpatient Visits           1 year ago Annual physical exam   [facility] Jacky Kindle, FNP   2 years ago Routine general medical examination at a health care facility   [facility] Jacky Kindle, FNP   3 years ago Annual physical exam   [facility] [name], [place]   3 years ago Chest discomfort   [facility] Osvaldo Angst M, [place]   3 years ago History of 2019 novel coronavirus disease (COVID-19)   [facility] [place], [place], [place]

## 2023-03-24 ENCOUNTER — Other Ambulatory Visit: Payer: Self-pay | Admitting: Family Medicine

## 2023-03-24 DIAGNOSIS — G8929 Other chronic pain: Secondary | ICD-10-CM

## 2023-03-24 NOTE — Telephone Encounter (Signed)
Requested medications are due for refill today.  yes  Requested medications are on the active medications list.  yes  Last refill. 03/08/2022 #90 3 rf  Future visit scheduled.   no  Notes to clinic.  Labs are expired.    Requested Prescriptions  Pending Prescriptions Disp Refills   meloxicam (MOBIC) 7.5 MG tablet [Pharmacy Med Name: MELOXICAM 7.5 MG TABLET] 90 tablet 3    Sig: TAKE 1 TABLET BY MOUTH EVERY DAY     Analgesics:  COX2 Inhibitors Failed - 03/24/2023  2:18 AM      Failed - Manual Review: Labs are only required if the patient has taken medication for more than 8 weeks.      Failed - HGB in normal range and within 360 days    Hemoglobin  Date Value Ref Range Status  03/08/2022 15.1 13.0 - 17.7 g/dL Final         Failed - Cr in normal range and within 360 days    Creatinine, Ser  Date Value Ref Range Status  03/08/2022 1.39 (H) 0.76 - 1.27 mg/dL Final         Failed - HCT in normal range and within 360 days    Hematocrit  Date Value Ref Range Status  03/08/2022 45.6 37.5 - 51.0 % Final         Failed - eGFR is 30 or above and within 360 days    GFR calc Af Amer  Date Value Ref Range Status  11/20/2019 79 >59 mL/min/1.73 Final   GFR calc non Af Amer  Date Value Ref Range Status  11/20/2019 68 >59 mL/min/1.73 Final   eGFR  Date Value Ref Range Status  03/08/2022 59 (L) >59 mL/min/1.73 Final         Failed - Valid encounter within last 12 months    Recent Outpatient Visits           1 year ago Annual physical exam   [facility] Merita Norton T, FNP   2 years ago Routine general medical examination at a health care facility   [facility] Merita Norton T, FNP   3 years ago Annual physical exam   [facility] Osvaldo Angst M, [place]   3 years ago Chest discomfort   [facility] Osvaldo Angst M, [place]   3 years ago History of 2019 novel coronavirus  disease (COVID-19)   [facility], Adriana M, [place]              Passed - AST in normal range and within 360 days    AST  Date Value Ref Range Status  11/05/2022 20 15 - 41 U/L Final         Passed - ALT in normal range and within 360 days    ALT  Date Value Ref Range Status  11/05/2022 22 0 - 44 U/L Final         Passed - Patient is not pregnant

## 2023-04-05 ENCOUNTER — Other Ambulatory Visit: Payer: Self-pay | Admitting: Family Medicine

## 2023-04-05 DIAGNOSIS — F419 Anxiety disorder, unspecified: Secondary | ICD-10-CM

## 2023-04-07 ENCOUNTER — Encounter: Payer: Self-pay | Admitting: Family Medicine

## 2023-04-07 ENCOUNTER — Ambulatory Visit: Payer: BC Managed Care – PPO | Admitting: Family Medicine

## 2023-04-07 VITALS — BP 119/75 | HR 62 | Ht 70.0 in | Wt 225.2 lb

## 2023-04-07 DIAGNOSIS — R5381 Other malaise: Secondary | ICD-10-CM

## 2023-04-07 DIAGNOSIS — Z2821 Immunization not carried out because of patient refusal: Secondary | ICD-10-CM

## 2023-04-07 DIAGNOSIS — N529 Male erectile dysfunction, unspecified: Secondary | ICD-10-CM

## 2023-04-07 DIAGNOSIS — F419 Anxiety disorder, unspecified: Secondary | ICD-10-CM

## 2023-04-07 DIAGNOSIS — N401 Enlarged prostate with lower urinary tract symptoms: Secondary | ICD-10-CM | POA: Diagnosis not present

## 2023-04-07 DIAGNOSIS — R351 Nocturia: Secondary | ICD-10-CM | POA: Diagnosis not present

## 2023-04-07 DIAGNOSIS — R5383 Other fatigue: Secondary | ICD-10-CM

## 2023-04-07 DIAGNOSIS — R739 Hyperglycemia, unspecified: Secondary | ICD-10-CM | POA: Diagnosis not present

## 2023-04-07 DIAGNOSIS — E7800 Pure hypercholesterolemia, unspecified: Secondary | ICD-10-CM | POA: Diagnosis not present

## 2023-04-07 DIAGNOSIS — B009 Herpesviral infection, unspecified: Secondary | ICD-10-CM

## 2023-04-07 DIAGNOSIS — Z125 Encounter for screening for malignant neoplasm of prostate: Secondary | ICD-10-CM | POA: Diagnosis not present

## 2023-04-07 MED ORDER — SILDENAFIL CITRATE 100 MG PO TABS
100.0000 mg | ORAL_TABLET | Freq: Every day | ORAL | 11 refills | Status: AC | PRN
Start: 2023-04-07 — End: ?

## 2023-04-07 MED ORDER — ROSUVASTATIN CALCIUM 40 MG PO TABS: 40.0000 mg | ORAL_TABLET | Freq: Every day | ORAL | 3 refills | Status: DC

## 2023-04-07 MED ORDER — BUSPIRONE HCL 15 MG PO TABS: 15.0000 mg | ORAL_TABLET | Freq: Two times a day (BID) | ORAL | 3 refills | Status: DC

## 2023-04-07 MED ORDER — ASPIRIN 81 MG PO TBEC: 81.0000 mg | DELAYED_RELEASE_TABLET | Freq: Every day | ORAL | Status: AC

## 2023-04-07 MED ORDER — FINASTERIDE 5 MG PO TABS: 5.0000 mg | ORAL_TABLET | Freq: Every day | ORAL | 3 refills | Status: DC

## 2023-04-07 NOTE — Assessment & Plan Note (Signed)
Chronic, stable Continues on buspar 15 mg BID No complaints

## 2023-04-07 NOTE — Assessment & Plan Note (Signed)
Chronic, known CAD; followed by cardiology Remains on Crestor 40 mg and ASA 81 mg recommend diet low in saturated fat and regular exercise - 30 min at least 5 times per week Repeat LP

## 2023-04-07 NOTE — Progress Notes (Signed)
Established patient visit   Patient: Luis Ray   DOB: 1966/03/12   57 y.o. Male  MRN: [id number] Visit Date: 04/07/2023  Today's healthcare provider: Jacky Kindle, FNP  Introduced to nurse practitioner role and practice setting.  All questions answered.  Discussed provider/patient relationship and expectations.  Subjective    Medication Refill   HPI     Medication Refill    Additional comments: Patient is needing refill on Buspar        Comments   Patient believes his testosterone levels are low and would also like to discuss erectile dysfunction      Last edited by Acey Lav, CMA on 04/07/2023  9:10 AM.      Medications: Outpatient Medications Prior to Visit  Medication Sig   diltiazem (CARDIZEM CD) 120 MG 24 hr capsule TAKE 1 CAPSULE BY MOUTH EVERY DAY   valACYclovir (VALTREX) 1000 MG tablet For HSV flares; use twice daily for 7-10 days.   [DISCONTINUED] aspirin EC 81 MG tablet Take 81 mg by mouth daily.   [DISCONTINUED] busPIRone (BUSPAR) 15 MG tablet TAKE 1 TABLET BY MOUTH 2 TIMES DAILY.   [DISCONTINUED] cetirizine (ZYRTEC) 10 MG tablet Take 10 mg by mouth daily.   [DISCONTINUED] finasteride (PROSCAR) 5 MG tablet 1.25 mg daily.   [DISCONTINUED] fluticasone (FLONASE) 50 MCG/ACT nasal spray Place 2 sprays into both nostrils daily.   [DISCONTINUED] meloxicam (MOBIC) 7.5 MG tablet Take 1 tablet (7.5 mg total) by mouth daily.   [DISCONTINUED] rosuvastatin (CRESTOR) 40 MG tablet Take 1 tablet (40 mg total) by mouth daily.   No facility-administered medications prior to visit.    Review of Systems    Objective    BP 119/75 (BP Location: Right Arm, Patient Position: Sitting, Cuff Size: Normal)   Pulse 62   Ht 5\' 10"  (1.778 m)   Wt 225 lb 3.2 oz (102.2 kg)   SpO2 98%   BMI 32.31 kg/m   Physical Exam Vitals and nursing note reviewed.  Constitutional:      Appearance: Normal appearance. He is obese.  HENT:     Head: Normocephalic and atraumatic.   Cardiovascular:     Rate and Rhythm: Normal rate and regular rhythm.     Pulses: Normal pulses.     Heart sounds: Normal heart sounds.  Pulmonary:     Effort: Pulmonary effort is normal.     Breath sounds: Normal breath sounds.  Musculoskeletal:        General: Normal range of motion.     Cervical back: Normal range of motion.  Skin:    General: Skin is warm and dry.     Capillary Refill: Capillary refill takes less than 2 seconds.  Neurological:     General: No focal deficit present.     Mental Status: He is alert and oriented to person, place, and time. Mental status is at baseline.  Psychiatric:        Mood and Affect: Mood normal.        Behavior: Behavior normal.        Thought Content: Thought content normal.        Judgment: Judgment normal.     No results found for any visits on 04/07/23.  Assessment & Plan     Problem List Items Addressed This Visit       Other   Benign prostatic hyperplasia with nocturia    Chronic, reports 1-3 voids at night Increase proscar to 5 mg to assist  with BPH and previous hx of alopecia  Repeat PSA      Relevant Medications   finasteride (PROSCAR) 5 MG tablet   Other Relevant Orders   PSA   Chronic anxiety - Primary    Chronic, stable Continues on buspar 15 mg BID No complaints       Relevant Medications   busPIRone (BUSPAR) 15 MG tablet   Other Relevant Orders   Vitamin D (25 hydroxy)   RESOLVED: HSV infection   Hypercholesterolemia without hypertriglyceridemia    Chronic, known CAD; followed by cardiology Remains on Crestor 40 mg and ASA 81 mg recommend diet low in saturated fat and regular exercise - 30 min at least 5 times per week Repeat LP       Relevant Medications   sildenafil (VIAGRA) 100 MG tablet   rosuvastatin (CRESTOR) 40 MG tablet   aspirin EC 81 MG tablet   Other Relevant Orders   Lipid panel   Inability to maintain erection    Acute on chronic, worsening Request for labs to assist  Wishes to  start testosterone to assist Trial of viagra 100 mg every day prn       Relevant Medications   sildenafil (VIAGRA) 100 MG tablet   Other Relevant Orders   Testosterone,Free and Total   PSA   CBC with Differential/Platelet   Comprehensive Metabolic Panel (CMET)   TSH   Lipid panel   Hemoglobin A1c   Vitamin D (25 hydroxy)   Influenza vaccination declined by patient   Malaise and fatigue    Acute on chronic, request for labs to assist Reports "I know I'm getting old... I'm almost 58" Body mass index is 32.31 kg/m. Continue to recommend balanced, lower carb meals. Smaller meal size, adding snacks. Choosing water as drink of choice and increasing purposeful exercise.       Relevant Orders   Testosterone,Free and Total   PSA   CBC with Differential/Platelet   Comprehensive Metabolic Panel (CMET)   TSH   Lipid panel   Hemoglobin A1c   Vitamin D (25 hydroxy)   Return if symptoms worsen or fail to improve, for annual examination.     Leilani Merl, FNP, have reviewed all documentation for this visit. The documentation on 04/07/23 for the exam, diagnosis, procedures, and orders are all accurate and complete.  Jacky Kindle, FNP  [facility] [phone] (phone) [phone] (fax)  [facility]

## 2023-04-07 NOTE — Assessment & Plan Note (Signed)
Acute on chronic, request for labs to assist Reports "I know I'm getting old... I'm almost 58" Body mass index is 32.31 kg/m. Continue to recommend balanced, lower carb meals. Smaller meal size, adding snacks. Choosing water as drink of choice and increasing purposeful exercise.

## 2023-04-07 NOTE — Assessment & Plan Note (Signed)
Acute on chronic, worsening Request for labs to assist  Wishes to start testosterone to assist Trial of viagra 100 mg every day prn

## 2023-04-07 NOTE — Patient Instructions (Signed)

## 2023-04-07 NOTE — Assessment & Plan Note (Signed)
Chronic, reports 1-3 voids at night Increase proscar to 5 mg to assist with BPH and previous hx of alopecia  Repeat PSA

## 2023-04-08 ENCOUNTER — Encounter: Payer: Self-pay | Admitting: Family Medicine

## 2023-04-08 ENCOUNTER — Other Ambulatory Visit: Payer: Self-pay | Admitting: Family Medicine

## 2023-04-08 DIAGNOSIS — N529 Male erectile dysfunction, unspecified: Secondary | ICD-10-CM

## 2023-04-10 ENCOUNTER — Other Ambulatory Visit: Payer: Self-pay | Admitting: Family Medicine

## 2023-04-10 LAB — COMPREHENSIVE METABOLIC PANEL
ALT: 19 IU/L (ref 0–44)
AST: 16 IU/L (ref 0–40)
Albumin: 4.7 g/dL (ref 3.8–4.9)
Alkaline Phosphatase: 68 IU/L (ref 44–121)
BUN/Creatinine Ratio: 13 (ref 9–20)
BUN: 16 mg/dL (ref 6–24)
Bilirubin Total: 0.5 mg/dL (ref 0.0–1.2)
CO2: 24 mmol/L (ref 20–29)
Calcium: 10.1 mg/dL (ref 8.7–10.2)
Chloride: 106 mmol/L (ref 96–106)
Creatinine, Ser: 1.22 mg/dL (ref 0.76–1.27)
Globulin, Total: 2.4 g/dL (ref 1.5–4.5)
Glucose: 68 mg/dL — ABNORMAL LOW (ref 70–99)
Potassium: 4.7 mmol/L (ref 3.5–5.2)
Sodium: 145 mmol/L — ABNORMAL HIGH (ref 134–144)
Total Protein: 7.1 g/dL (ref 6.0–8.5)
eGFR: 69 mL/min/{1.73_m2} (ref >59)

## 2023-04-10 LAB — HEMOGLOBIN A1C
Est. average glucose Bld gHb Est-mCnc: 111 mg/dL
Hgb A1c MFr Bld: 5.5 % (ref 4.8–5.6)

## 2023-04-10 LAB — LIPID PANEL
Chol/HDL Ratio: 2.8 ratio (ref 0.0–5.0)
Cholesterol, Total: 136 mg/dL (ref 100–199)
HDL: 49 mg/dL
LDL Chol Calc (NIH): 66 mg/dL (ref 0–99)
Triglycerides: 119 mg/dL (ref 0–149)
VLDL Cholesterol Cal: 21 mg/dL (ref 5–40)

## 2023-04-10 LAB — CBC WITH DIFFERENTIAL/PLATELET
Basophils Absolute: 0.1 10*3/uL (ref 0.0–0.2)
Basos: 1 %
EOS (ABSOLUTE): 0.2 10*3/uL (ref 0.0–0.4)
Eos: 2 %
Hematocrit: 47.2 % (ref 37.5–51.0)
Hemoglobin: 15.5 g/dL (ref 13.0–17.7)
Immature Grans (Abs): 0 10*3/uL (ref 0.0–0.1)
Immature Granulocytes: 0 %
Lymphocytes Absolute: 1.9 10*3/uL (ref 0.7–3.1)
Lymphs: 29 %
MCH: 30.4 pg (ref 26.6–33.0)
MCHC: 32.8 g/dL (ref 31.5–35.7)
MCV: 93 fL (ref 79–97)
Monocytes Absolute: 0.8 10*3/uL (ref 0.1–0.9)
Monocytes: 11 %
Neutrophils Absolute: 3.7 10*3/uL (ref 1.4–7.0)
Neutrophils: 57 %
Platelets: 219 10*3/uL (ref 150–450)
RBC: 5.1 x10E6/uL (ref 4.14–5.80)
RDW: 13.3 % (ref 11.6–15.4)
WBC: 6.6 10*3/uL (ref 3.4–10.8)

## 2023-04-10 LAB — TESTOSTERONE,FREE AND TOTAL
Testosterone, Free: 4.6 pg/mL — ABNORMAL LOW (ref 7.2–24.0)
Testosterone: 821 ng/dL (ref 264–916)

## 2023-04-10 LAB — PSA: Prostate Specific Ag, Serum: 0.3 ng/mL (ref 0.0–4.0)

## 2023-04-10 LAB — TSH: TSH: 1.7 u[IU]/mL (ref 0.450–4.500)

## 2023-04-10 LAB — VITAMIN D 25 HYDROXY (VIT D DEFICIENCY, FRACTURES): Vit D, 25-Hydroxy: 36.8 ng/mL (ref 30.0–100.0)

## 2023-04-13 ENCOUNTER — Other Ambulatory Visit: Payer: Self-pay | Admitting: Family Medicine

## 2023-04-13 ENCOUNTER — Telehealth: Payer: Self-pay | Admitting: Family Medicine

## 2023-04-13 DIAGNOSIS — R7989 Other specified abnormal findings of blood chemistry: Secondary | ICD-10-CM

## 2023-04-13 MED ORDER — TESTOSTERONE ENANTHATE 100 MG/0.5ML ~~LOC~~ SOAJ: 100.0000 mg | SUBCUTANEOUS | 1 refills | Status: DC

## 2023-04-13 MED ORDER — "BD ECLIPSE SYRINGE 21G X 1"" 3 ML MISC": 1.0000 | 1 refills | Status: DC

## 2023-04-13 NOTE — Telephone Encounter (Signed)
Received a fax from covermymeds for Xyosted 100mg /0.15ml  Key:  [id number]

## 2023-04-13 NOTE — Telephone Encounter (Signed)
Luis Ray came by the office this morning and wants you to call him to discuss the testosterone please.   He said he keeps getting the same messages through my chart, thus he wants a phone call please from [place].  [phone]

## 2023-04-15 ENCOUNTER — Telehealth: Payer: Self-pay | Admitting: Family Medicine

## 2023-04-15 ENCOUNTER — Telehealth: Payer: Self-pay

## 2023-04-15 ENCOUNTER — Other Ambulatory Visit: Payer: Self-pay | Admitting: Family Medicine

## 2023-04-15 DIAGNOSIS — R7989 Other specified abnormal findings of blood chemistry: Secondary | ICD-10-CM

## 2023-04-15 DIAGNOSIS — N529 Male erectile dysfunction, unspecified: Secondary | ICD-10-CM

## 2023-04-15 MED ORDER — TESTOSTERONE 1.62 % TD GEL: 4.0000 | Freq: Every day | TRANSDERMAL | 0 refills | Status: DC

## 2023-04-15 NOTE — Telephone Encounter (Signed)
Copied from CRM [id number]. Topic: General - Other >> Apr 15, 2023  8:19 AM Everette C wrote: Reason for CRM: The patient has been directed by their Pharmacy to contact their PCP and request an alternative prescription to Testosterone Enanthate 100 MG/0.5ML SOAJ [[id number]]  Please contact further when possible

## 2023-04-15 NOTE — Telephone Encounter (Signed)
Pa initiated

## 2023-04-15 NOTE — Telephone Encounter (Signed)
Covermymeds is requesting prior authorization Key: [id number] Name: Tomas Testosterone 20.25MG /ACT 1.62 % Gel

## 2023-04-19 NOTE — Telephone Encounter (Signed)
Testosterone 20.25 mg/ACT(1.62%) gel was sent in for patient in another encounter PA has been initiated for that.

## 2023-04-19 NOTE — Telephone Encounter (Signed)
PA started

## 2023-04-25 ENCOUNTER — Telehealth: Payer: Self-pay | Admitting: Family Medicine

## 2023-04-25 DIAGNOSIS — F419 Anxiety disorder, unspecified: Secondary | ICD-10-CM

## 2023-04-25 NOTE — Telephone Encounter (Signed)
Medication Refill - Medication: meloxicam(doesn't have mg//busPIRone (BUSPAR) 15 MG tablet / propetia(doesn't have mg)/rosuvastatin (CRESTOR) 40 MG tablet  Pt says left it in the room from vacation  Has the patient contacted their pharmacy? no  Preferred Pharmacy (with phone number or street name): [id number] [place], [place] - [address] Phone: [phone]  Fax: [phone]   Has the patient been seen for an appointment in the last year OR does the patient have an upcoming appointment? yes  Agent: Please be advised that RX refills may take up to 3 business days. We ask that you follow-up with your pharmacy.

## 2023-04-25 NOTE — Addendum Note (Signed)
Addended by: Marjie Skiff on: 04/25/2023 03:00 PM   Modules accepted: Orders

## 2023-04-25 NOTE — Telephone Encounter (Signed)
Please review

## 2023-04-25 NOTE — Telephone Encounter (Signed)
[facility] called and spoke to Luis Ray, [facility] about the refill(s) finasteride, rosuvastatin, and buspirone requested. Advised it was sent on 04/07/23. She says he picked them all up and not due for refills. Advised he left them in his hotel room while on vacation. She says the insurance will not pay for them again, he will have to pay out of pocket and the provider will need to approve early refill of Buspirone.

## 2023-04-25 NOTE — Telephone Encounter (Signed)
Denied on September 19 by [facility] 2017 Denied. The request for coverage of Testosterone gel is denied. This medication is covered for males with symptoms of low testosterone (such as low energy, erectile dysfunction, etc) when two early morning, low testosterone levels are sent in for review. In this case, the member does not have symptoms of low testosterone, and testosterone levels were not sent in for review. Utilization management guidelines used: [employer] [place] [employer] [place] Standard Utilization Management Criteria Drug Testosterone 20.25 MG/ACT(1.62%) gel  FYI-Patient only has had one abnormal testosterone level.

## 2023-04-25 NOTE — Telephone Encounter (Signed)
Patient aware. Will come in tomorrow to have labs drawn

## 2023-04-26 ENCOUNTER — Other Ambulatory Visit: Payer: Self-pay | Admitting: Family Medicine

## 2023-04-26 DIAGNOSIS — R7989 Other specified abnormal findings of blood chemistry: Secondary | ICD-10-CM | POA: Diagnosis not present

## 2023-04-26 DIAGNOSIS — G8929 Other chronic pain: Secondary | ICD-10-CM

## 2023-04-26 NOTE — Telephone Encounter (Signed)
Left vm w/ pharmacy for ok  on early refills

## 2023-04-27 LAB — TESTOSTERONE,FREE AND TOTAL: Testosterone: 611 ng/dL (ref 264–916)

## 2023-04-27 NOTE — Telephone Encounter (Signed)
Unable to refill per protocol, Rx expired. Discontinued 04/07/23.  Requested Prescriptions  Pending Prescriptions Disp Refills   meloxicam (MOBIC) 7.5 MG tablet [Pharmacy Med Name: MELOXICAM 7.5 MG TABLET] 90 tablet 3    Sig: TAKE 1 TABLET BY MOUTH EVERY DAY     Analgesics:  COX2 Inhibitors Failed - 04/26/2023  4:59 PM      Failed - Manual Review: Labs are only required if the patient has taken medication for more than 8 weeks.      Passed - HGB in normal range and within 360 days    Hemoglobin  Date Value Ref Range Status  04/07/2023 15.5 13.0 - 17.7 g/dL Final         Passed - Cr in normal range and within 360 days    Creatinine, Ser  Date Value Ref Range Status  04/07/2023 1.22 0.76 - 1.27 mg/dL Final         Passed - HCT in normal range and within 360 days    Hematocrit  Date Value Ref Range Status  04/07/2023 47.2 37.5 - 51.0 % Final         Passed - AST in normal range and within 360 days    AST  Date Value Ref Range Status  04/07/2023 16 0 - 40 IU/L Final         Passed - ALT in normal range and within 360 days    ALT  Date Value Ref Range Status  04/07/2023 19 0 - 44 IU/L Final         Passed - eGFR is 30 or above and within 360 days    GFR calc Af Amer  Date Value Ref Range Status  11/20/2019 79 >59 mL/min/1.73 Final   GFR calc non Af Amer  Date Value Ref Range Status  11/20/2019 68 >59 mL/min/1.73 Final   eGFR  Date Value Ref Range Status  04/07/2023 69 >59 mL/min/1.73 Final         Passed - Patient is not pregnant      Passed - Valid encounter within last 12 months    Recent Outpatient Visits           2 weeks ago Chronic anxiety   [facility] Jacky Kindle, FNP   1 year ago Annual physical exam   [facility] Jacky Kindle, FNP   2 years ago Routine general medical examination at a health care facility   [facility] Merita Norton T, FNP   3 years ago Annual  physical exam   [facility] Osvaldo Angst M, [place]   3 years ago Chest discomfort   [facility] [place], Ricki Rodriguez Theodore, [place]

## 2023-05-02 ENCOUNTER — Other Ambulatory Visit: Payer: Self-pay | Admitting: Family Medicine

## 2023-05-02 ENCOUNTER — Telehealth: Payer: Self-pay | Admitting: Family Medicine

## 2023-05-02 MED ORDER — TESTOSTERONE CYPIONATE 200 MG/ML IJ SOLN: 100.0000 mg | INTRAMUSCULAR | 0 refills | Status: AC

## 2023-05-02 NOTE — Telephone Encounter (Signed)
Covermymeds is requesting prior authorization Key: [id number] Name: Luis Ray  Testosterone Cypionate 200MG /ML

## 2023-05-04 NOTE — Telephone Encounter (Signed)
Different key was given when prior authorization was created [facility]

## 2023-05-06 NOTE — Telephone Encounter (Signed)
Outcome Approved on October 2 by [facility] 2017 Approved. Authorization Expiration Date: 05/03/2024 Drug Testosterone Cypionate 200MG /ML intramuscular solution

## 2023-06-01 ENCOUNTER — Other Ambulatory Visit: Payer: Self-pay | Admitting: Family Medicine

## 2023-06-01 DIAGNOSIS — G8929 Other chronic pain: Secondary | ICD-10-CM

## 2023-06-02 NOTE — Telephone Encounter (Signed)
Refused Mobie 7.5 mg because it was discontinued 04/07/2023.

## 2023-06-11 ENCOUNTER — Other Ambulatory Visit: Payer: Self-pay | Admitting: Family Medicine

## 2023-06-13 DIAGNOSIS — S83411A Sprain of medial collateral ligament of right knee, initial encounter: Secondary | ICD-10-CM | POA: Diagnosis not present

## 2023-06-13 DIAGNOSIS — M25561 Pain in right knee: Secondary | ICD-10-CM | POA: Diagnosis not present

## 2023-06-13 NOTE — Telephone Encounter (Signed)
Requested medication (s) are due for refill today: Yes  Requested medication (s) are on the active medication list: Yes  Last refill:  04/13/23  Future visit scheduled: No  Notes to clinic:  See request.    Requested Prescriptions  Pending Prescriptions Disp Refills   BD PLASTIPAK SYRINGE 21G X 1" 3 ML MISC [Pharmacy Med Name: BD 3 ML SYRINGE 21GX1"] 4 each 1    Sig: 1 EACH BY DOES NOT APPLY ROUTE ONCE A WEEK.     There is no refill protocol information for this order

## 2023-07-05 DIAGNOSIS — M1711 Unilateral primary osteoarthritis, right knee: Secondary | ICD-10-CM | POA: Diagnosis not present

## 2023-07-29 ENCOUNTER — Other Ambulatory Visit: Payer: Self-pay | Admitting: Physician Assistant

## 2023-07-29 DIAGNOSIS — M1711 Unilateral primary osteoarthritis, right knee: Secondary | ICD-10-CM | POA: Diagnosis not present

## 2023-08-09 DIAGNOSIS — M25561 Pain in right knee: Secondary | ICD-10-CM | POA: Diagnosis not present

## 2023-08-17 DIAGNOSIS — M25561 Pain in right knee: Secondary | ICD-10-CM | POA: Diagnosis not present

## 2023-08-30 DIAGNOSIS — M25561 Pain in right knee: Secondary | ICD-10-CM | POA: Diagnosis not present

## 2023-09-05 DIAGNOSIS — M25561 Pain in right knee: Secondary | ICD-10-CM | POA: Diagnosis not present

## 2023-09-08 DIAGNOSIS — M25561 Pain in right knee: Secondary | ICD-10-CM | POA: Diagnosis not present

## 2023-09-13 DIAGNOSIS — M1711 Unilateral primary osteoarthritis, right knee: Secondary | ICD-10-CM | POA: Diagnosis not present

## 2023-09-13 DIAGNOSIS — S83241A Other tear of medial meniscus, current injury, right knee, initial encounter: Secondary | ICD-10-CM | POA: Diagnosis not present

## 2023-09-16 ENCOUNTER — Encounter: Payer: Self-pay | Admitting: Physician Assistant

## 2023-09-16 ENCOUNTER — Other Ambulatory Visit: Payer: Self-pay

## 2023-09-16 ENCOUNTER — Ambulatory Visit (INDEPENDENT_AMBULATORY_CARE_PROVIDER_SITE_OTHER): Payer: Self-pay | Admitting: Physician Assistant

## 2023-09-16 VITALS — BP 140/88 | HR 96 | Temp 99.5°F | Ht 70.0 in | Wt 215.0 lb

## 2023-09-16 DIAGNOSIS — R051 Acute cough: Secondary | ICD-10-CM

## 2023-09-16 DIAGNOSIS — J101 Influenza due to other identified influenza virus with other respiratory manifestations: Secondary | ICD-10-CM

## 2023-09-16 LAB — POC SOFIA 2 FLU + SARS ANTIGEN FIA
Influenza A, POC: POSITIVE — AB
Influenza B, POC: NEGATIVE
SARS Coronavirus 2 Ag: NEGATIVE

## 2023-09-16 NOTE — Progress Notes (Signed)
[facility] [address] [place], [place] [address]   Office Visit Note  Patient Name: Luis Ray Date of Birth [id number]  Medical Record number [id number]  Date of Service: 09/16/2023  Chief Complaint  Patient presents with   Sick visit    Patient c/o body aches, cough with clear sputum, and headache. "The cough is the worst. It is causing pain in my rib cage from coughing so much." Denies fever. Symptoms began 2 days ago. He states his immediate family members had norovirus last week.      HPI Pt is here for a sick visit. Having flu like symptoms since Wednesday night, 2 days ago. Symptoms include: dry cough, HA, congestion, BA. Some rhinorrhea.  Tried nasal flush which helps. Mucinex 12hr cough med helps as well. No aggravating factors.  No F/C, ear pain/drainage, CP, SOB, abd pain, n/v/d/c, other symptoms. No known sick contacts.  Did not get flu shot this season.    ROS: Review of Systems  Constitutional:  Positive for fatigue. Negative for chills and fever.  HENT:  Positive for congestion and rhinorrhea. Negative for ear discharge, ear pain and sore throat.   Respiratory:  Positive for cough. Negative for shortness of breath.   Cardiovascular:  Negative for chest pain.  Gastrointestinal:  Negative for abdominal pain, constipation, diarrhea, nausea and vomiting.  Musculoskeletal:  Positive for myalgias.  Allergic/Immunologic: Negative for immunocompromised state.     Current Medication:  Outpatient Encounter Medications as of 09/16/2023  Medication Sig   aspirin EC 81 MG tablet Take 1 tablet (81 mg total) by mouth daily. Swallow whole.   BD PLASTIPAK SYRINGE 21G X 1" 3 ML MISC 1 EACH BY DOES NOT APPLY ROUTE ONCE A WEEK.   busPIRone (BUSPAR) 15 MG tablet Take 1 tablet (15 mg total) by mouth 2 (two) times daily.   diltiazem (CARDIZEM CD) 120 MG 24 hr capsule TAKE 1 CAPSULE BY MOUTH EVERY DAY   finasteride (PROSCAR) 5 MG tablet Take 1 tablet (5 mg total) by  mouth daily.   rosuvastatin (CRESTOR) 40 MG tablet Take 1 tablet (40 mg total) by mouth daily.   sildenafil (VIAGRA) 100 MG tablet Take 1 tablet (100 mg total) by mouth daily as needed for erectile dysfunction.   Testosterone 1.62 % GEL Place 4 Pump onto the skin daily.   Testosterone Cypionate 200 MG/ML SOLN Inject 100 mg as directed once a week.   Testosterone Enanthate 100 MG/0.5ML SOAJ Inject 0.5 mLs (100 mg total) into the skin once a week.   valACYclovir (VALTREX) 1000 MG tablet For HSV flares; use twice daily for 7-10 days.   No facility-administered encounter medications on file as of 09/16/2023.      Medical History: Past Medical History:  Diagnosis Date   Alopecia    History of 2019 novel coronavirus disease (COVID-19) 06/2019   Insomnia      Vital Signs: BP (!) 140/88   Pulse 96   Temp 99.5 F (37.5 C)   Ht 5\' 10"  (1.778 m)   Wt 97.5 kg   SpO2 96%   BMI 30.85 kg/m    Physical Exam Vitals and nursing note reviewed.  Constitutional:      General: He is not in acute distress.    Appearance: Normal appearance. He is well-developed. He is not toxic-appearing.     Comments: Afebrile, nontoxic, NAD  HENT:     Head: Normocephalic and atraumatic.     Nose: Congestion present.  Mouth/Throat:     Mouth: Mucous membranes are moist.     Pharynx: Oropharynx is clear. Uvula midline. No pharyngeal swelling, oropharyngeal exudate, posterior oropharyngeal erythema or uvula swelling.  Eyes:     General:        Right eye: No discharge.        Left eye: No discharge.     Conjunctiva/sclera: Conjunctivae normal.  Cardiovascular:     Rate and Rhythm: Normal rate and regular rhythm.     Pulses: Normal pulses.     Heart sounds: Normal heart sounds, S1 normal and S2 normal. No murmur heard.    No friction rub. No gallop.  Pulmonary:     Effort: Pulmonary effort is normal. No respiratory distress.     Breath sounds: Normal breath sounds. No decreased breath sounds,  wheezing, rhonchi or rales.     Comments: CTAB Abdominal:     General: Bowel sounds are normal. There is no distension.     Palpations: Abdomen is soft. Abdomen is not rigid.     Tenderness: There is no abdominal tenderness. There is no right CVA tenderness, left CVA tenderness, guarding or rebound.  Musculoskeletal:        General: Normal range of motion.     Cervical back: Normal range of motion and neck supple.  Skin:    General: Skin is warm and dry.     Findings: No rash.  Neurological:     Mental Status: He is alert and oriented to person, place, and time.     Sensory: Sensation is intact. No sensory deficit.     Motor: Motor function is intact.  Psychiatric:        Mood and Affect: Mood and affect normal.        Behavior: Behavior normal.       Assessment/Plan:   ICD-10-CM   1. Influenza A  J10.1     2. Acute cough  R05.1 POC SOFIA 2 FLU + SARS ANTIGEN FIA       -Pt here with flu like symptoms x2 days, exam reveals pt is afebrile with a clear lung exam, mild nasal congestion, throat benign.  -Rapid COVID/flu +FluA/neg COVID and flu B  -Doubt need for other testing at this time.  -Discussed tamiflu/xofluza, pt outside of window of benefit and not at risk population, doubt need for these -Discussed OTCs for symptomatic relief and supportive care (i.e. Alternating Tylenol 1000mg  max and Ibuprofen 600-800mg  max, Chloraseptic spray/throat lozenges for sore throat, hot tea with honey/lemon for sore throat, Flonase 2 sprays each nostril twice daily and Netipot for congestion, antihistamines like Zyrtec/Claritin/Xyzal/etc, decongestants like Sudafed for congestion with caution, Mucinex D for cough/congestion or Mucinex DM for cough/expectoration, adequate hydration and rest, etc).  -F/up as needed or within next week or so if not improving.  -Strict return/ED guidelines discussed.   General Counseling: Luis Ray understanding of the findings of todays visit and agrees  with plan of treatment. I have discussed any further diagnostic evaluation that may be needed or ordered today. We also reviewed his medications today. he has been encouraged to call the office with any questions or concerns that should arise related to todays visit.   Orders Placed This Encounter  Procedures   POC SOFIA 2 FLU + SARS ANTIGEN FIA   Results for orders placed or performed in visit on 09/16/23 (from the past 24 hours)  POC SOFIA 2 FLU + SARS ANTIGEN FIA     Status: Abnormal  Collection Time: 09/16/23  9:55 AM  Result Value Ref Range   Influenza A, POC Positive (A) Negative   Influenza B, POC Negative Negative   SARS Coronavirus 2 Ag Negative Negative     No orders of the defined types were placed in this encounter.   Time spent: [id number], Development worker, international aid

## 2023-09-19 ENCOUNTER — Ambulatory Visit (INDEPENDENT_AMBULATORY_CARE_PROVIDER_SITE_OTHER): Payer: Self-pay | Admitting: Oncology

## 2023-09-19 DIAGNOSIS — R051 Acute cough: Secondary | ICD-10-CM

## 2023-09-19 MED ORDER — PREDNISONE 10 MG (21) PO TBPK
ORAL_TABLET | ORAL | 0 refills | Status: DC
Start: 2023-09-19 — End: 2024-04-19

## 2023-09-19 MED ORDER — AZITHROMYCIN 250 MG PO TABS
ORAL_TABLET | ORAL | 0 refills | Status: AC
Start: 2023-09-19 — End: 2023-09-24

## 2023-09-19 NOTE — Progress Notes (Signed)
Virtual Visit via Telephone Note  I connected with Luis Ray on 09/19/23 at  2:00 PM EST by telephone and verified that I am speaking with the correct person using two identifiers.  Location: Patient: Home Provider: Clinic   I discussed the limitations, risks, security and privacy concerns of performing an evaluation and management service by telephone and the availability of in person appointments. I also discussed with the patient that there may be a patient responsible charge related to this service. The patient expressed understanding and agreed to proceed.  History of Present Illness: Patient is an 58 y.o. student here for complaints of cough. Diagnosed with Flu on Friday. Has been using several types of cough suppressant, drinking hot tea, taking hot showers which have helped momentarily.  Having trouble sleeping at night.  Reports very low appetite.  Denies any fevers.  Body aches and pains have improved but cough has lingered and worsened.  Reports chest pain when he coughs.  No underlying respiratory disorders.  No recent antibiotics.  Has allergy to sulfa antibiotics.  Observations/Objective:Review of Systems  Constitutional:  Positive for malaise/fatigue.  Respiratory:  Positive for cough and shortness of breath.   Gastrointestinal:  Negative for nausea and vomiting.  Musculoskeletal:  Negative for myalgias.  Neurological:  Positive for headaches.  Psychiatric/Behavioral:  The patient has insomnia.    Physical Exam Neurological:     Mental Status: He is alert and oriented to person, place, and time.     Assessment and Plan: 1. Acute cough (Primary) Patient tested positive for the flu on 09/16/2023 after 2 to 3 days worth of symptoms.  Body aches, nasal congestion have resolved but cough has lingered and/or worsened.  He has tried over-the-counter medications including Robitussin, DayQuil/NyQuil, hot showers and drinking tea without improvement.  Recommend prednisone taper x  6 days along with azithromycin to cover for atypical pneumonia.  Recommend chest x-ray if no improvement in the next 3 to 4 days.  Continue OTC cough suppressants and decongestants as needed.  Please avoid NSAIDs such as ibuprofen or Motrin with steroids.  Make sure you take your steroids earlier in the day to avoid sleep disruption.  Keep me updated on your symptoms.  - azithromycin (ZITHROMAX) 250 MG tablet; Take 2 tablets on day 1, then 1 tablet daily on days 2 through 5  Dispense: 6 tablet; Refill: 0 - predniSONE (STERAPRED UNI-PAK 21 TAB) 10 MG (21) TBPK tablet; Take 6 tabs on day 1, 5 tabs on day 2 and decrease by one tab until done. Take with breakfast.  Dispense: 21 tablet; Refill: 0    Follow Up Instructions: Return to clinic as needed or if no improvement with steroids and antibiotics.   I discussed the assessment and treatment plan with the patient. The patient was provided an opportunity to ask questions and all were answered. The patient agreed with the plan and demonstrated an understanding of the instructions.   The patient was advised to call back or seek an in-person evaluation if the symptoms worsen or if the condition fails to improve as anticipated.  I provided 22 minutes of non-face-to-face time during this encounter.   Mauro Kaufmann, NP

## 2023-10-24 ENCOUNTER — Telehealth: Payer: Self-pay | Admitting: Family Medicine

## 2023-10-24 NOTE — Telephone Encounter (Signed)
 [facility] faxed refill request for the following medications:   Testosterone Cypionate 200 MG/ML SOLN    Please advise.

## 2023-10-26 ENCOUNTER — Telehealth: Payer: Self-pay | Admitting: Family Medicine

## 2023-10-26 NOTE — Telephone Encounter (Signed)
 [facility] is requesting refill Testosterone Cypionate 200 MG/ML SOLN  Please advise

## 2023-10-27 ENCOUNTER — Encounter: Payer: Self-pay | Admitting: Family Medicine

## 2023-10-27 ENCOUNTER — Encounter: Payer: Self-pay | Admitting: Physician Assistant

## 2023-11-02 ENCOUNTER — Other Ambulatory Visit: Payer: Self-pay | Admitting: Family Medicine

## 2023-11-02 DIAGNOSIS — R7989 Other specified abnormal findings of blood chemistry: Secondary | ICD-10-CM

## 2023-11-29 ENCOUNTER — Encounter: Payer: Self-pay | Admitting: Cardiovascular Disease

## 2023-11-29 ENCOUNTER — Ambulatory Visit: Attending: Cardiovascular Disease | Admitting: Cardiovascular Disease

## 2023-11-29 VITALS — BP 136/74 | HR 76 | Ht 70.0 in | Wt 228.0 lb

## 2023-11-29 DIAGNOSIS — I493 Ventricular premature depolarization: Secondary | ICD-10-CM

## 2023-11-29 DIAGNOSIS — E785 Hyperlipidemia, unspecified: Secondary | ICD-10-CM

## 2023-11-29 DIAGNOSIS — I2510 Atherosclerotic heart disease of native coronary artery without angina pectoris: Secondary | ICD-10-CM

## 2023-11-29 NOTE — Progress Notes (Signed)
 Cardiology Office Note   Date:  11/29/2023   ID:  Luis Ray, DOB July 23, 1966, MRN [id number]  PCP:  Blane Bunting, PA-C  Cardiologist:   Antionette Kirks, MD   No chief complaint on file.     History of Present Illness: Luis Ray is a 58 y.o. male who is here today for follow-up visit regarding coronary atherosclerosis and symptomatic PVCs.   He does have family history of coronary artery disease.  His father had CABG at the age of 94.  Multiple members on his mother side had myocardial infarction in their 71s and 36s. He is not a smoker.  He had COVID-19 infection in November 2020 which did not require hospitalization.  After that, he had intermittent chest pain and palpitations.  CTA of the coronary arteries was performed in May 2021 which showed mild nonobstructive coronary artery disease.  Calcium  score was 86.  His echocardiogram showed normal LV systolic function with no significant valvular abnormalities and no evidence of pulmonary hypertension. PVCs responded very well to diltiazem .    He has been doing very well with no chest pain, shortness of breath or palpitations.  He gained weight since last year due to decreased activities.  He has some injury to his right knee.  Past Medical History:  Diagnosis Date   Alopecia    History of 2019 novel coronavirus disease (COVID-19) 06/2019   Insomnia     Past Surgical History:  Procedure Laterality Date   COLONOSCOPY WITH PROPOFOL  N/A 03/20/2020   Procedure: COLONOSCOPY WITH PROPOFOL ;  Surgeon: Marnee Sink, MD;  Location: [facility];  Service: Endoscopy;  Laterality: N/A;  priority 4   ROTATOR CUFF REPAIR Right      Current Outpatient Medications  Medication Sig Dispense Refill   aspirin  EC 81 MG tablet Take 1 tablet (81 mg total) by mouth daily. Swallow whole.     BD PLASTIPAK SYRINGE 21G X 1" 3 ML MISC 1 EACH BY DOES NOT APPLY ROUTE ONCE A WEEK. 4 each 1   busPIRone  (BUSPAR ) 15 MG tablet Take 1 tablet (15  mg total) by mouth 2 (two) times daily. 180 tablet 3   diltiazem  (CARDIZEM  CD) 120 MG 24 hr capsule TAKE 1 CAPSULE BY MOUTH EVERY DAY 90 capsule 3   finasteride  (PROSCAR ) 5 MG tablet Take 1 tablet (5 mg total) by mouth daily. 90 tablet 3   meloxicam  (MOBIC ) 15 MG tablet TAKE 1 TABLET EVERY DAY BY ORAL ROUTE WITH MEAL(S).     predniSONE  (STERAPRED UNI-PAK 21 TAB) 10 MG (21) TBPK tablet Take 6 tabs on day 1, 5 tabs on day 2 and decrease by one tab until done. Take with breakfast. 21 tablet 0   rosuvastatin  (CRESTOR ) 40 MG tablet Take 1 tablet (40 mg total) by mouth daily. 90 tablet 3   sildenafil  (VIAGRA ) 100 MG tablet Take 1 tablet (100 mg total) by mouth daily as needed for erectile dysfunction. 30 tablet 11   Testosterone  Cypionate 200 MG/ML SOLN Inject 100 mg as directed once a week. 30 mL 0   valACYclovir  (VALTREX ) 1000 MG tablet For HSV flares; use twice daily for 7-10 days. 90 tablet 0   No current facility-administered medications for this visit.    Allergies:   Sulfa antibiotics    Social History:  The patient  reports that he has never smoked. He has never used smokeless tobacco. He reports current alcohol use of about 8.0 standard drinks of alcohol per week. He  reports that he does not use drugs.   Family History:  The patient's family history includes Coronary artery disease in his father.    ROS:  Please see the history of present illness.   Otherwise, review of systems are positive for none.   All other systems are reviewed and negative.    PHYSICAL EXAM: VS:  BP 136/74   Pulse 76   Ht 5\' 10"  (1.778 m)   Wt 228 lb (103.4 kg)   SpO2 93%   BMI 32.71 kg/m  , BMI Body mass index is 32.71 kg/m. GEN: Well nourished, well developed, in no acute distress  HEENT: normal  Neck: no JVD, carotid bruits, or masses Cardiac: RRR with premature beats; no murmurs, rubs, or gallops,no edema  Respiratory:  clear to auscultation bilaterally, normal work of breathing GI: soft, nontender,  nondistended, + BS MS: no deformity or atrophy  Skin: warm and dry, no rash Neuro:  Strength and sensation are intact Psych: euthymic mood, full affect   EKG:  EKG is ordered today. The ekg ordered today demonstrates: Normal sinus rhythm Normal ECG When compared with ECG of 14-Aug-2021 09:16, No significant change was found    Recent Labs: 04/07/2023: ALT 19; BUN 16; Creatinine, Ser 1.22; Hemoglobin 15.5; Platelets 219; Potassium 4.7; Sodium 145; TSH 1.700    Lipid Panel    Component Value Date/Time   CHOL 136 04/07/2023 0935   TRIG 119 04/07/2023 0935   HDL 49 04/07/2023 0935   CHOLHDL 2.8 04/07/2023 0935   CHOLHDL 3.0 11/05/2022 0819   VLDL 21 11/05/2022 0819   LDLCALC 66 04/07/2023 0935      Wt Readings from Last 3 Encounters:  11/29/23 228 lb (103.4 kg)  09/16/23 215 lb (97.5 kg)  04/07/23 225 lb 3.2 oz (102.2 kg)            No data to display            ASSESSMENT AND PLAN:  1.  Mild coronary atherosclerosis: Currently with no anginal symptoms.  Continue low-dose aspirin  and medical therapy.    2.  Symptomatic PVCs: Echocardiogram showed no evidence of cardiomyopathy or structural heart abnormalities.  He is doing very well with no evidence of recurrent PVCs by symptoms, exam or EKG. continue diltiazem .  3. Hyperlipidemia: He is doing very well with rosuvastatin  40 mg daily.  Most recent lipid profile showed an LDL of 66 which is at target.    Disposition:   FU with me in 12 months  Signed,  Antionette Kirks, MD  11/29/2023 1:54 PM    [facility]

## 2023-11-29 NOTE — Patient Instructions (Signed)
 Medication Instructions:  No changes *If you need a refill on your cardiac medications before your next appointment, please call your pharmacy*  Lab Work: None ordered If you have labs (blood work) drawn today and your tests are completely normal, you will receive your results only by: MyChart Message (if you have MyChart) OR A paper copy in the mail If you have any lab test that is abnormal or we need to change your treatment, we will call you to review the results.  Testing/Procedures: None ordered  Follow-Up: At [facility], you and your health needs are our priority.  As part of our continuing mission to provide you with exceptional heart care, our providers are all part of one team.  This team includes your primary Cardiologist (physician) and Advanced Practice Providers or APPs (Physician Assistants and Nurse Practitioners) who all work together to provide you with the care you need, when you need it.  Your next appointment:   12 month(s)  Provider:   Dr. Alvenia Aus  We recommend signing up for the patient portal called "MyChart".  Sign up information is provided on this After Visit Summary.  MyChart is used to connect with patients for Virtual Visits (Telemedicine).  Patients are able to view lab/test results, encounter notes, upcoming appointments, etc.  Non-urgent messages can be sent to your provider as well.   To learn more about what you can do with MyChart, go to [email or web address].

## 2023-12-13 DIAGNOSIS — E559 Vitamin D deficiency, unspecified: Secondary | ICD-10-CM | POA: Diagnosis not present

## 2023-12-13 DIAGNOSIS — E782 Mixed hyperlipidemia: Secondary | ICD-10-CM | POA: Diagnosis not present

## 2023-12-13 DIAGNOSIS — R5383 Other fatigue: Secondary | ICD-10-CM | POA: Diagnosis not present

## 2023-12-13 DIAGNOSIS — E119 Type 2 diabetes mellitus without complications: Secondary | ICD-10-CM | POA: Diagnosis not present

## 2023-12-13 DIAGNOSIS — N529 Male erectile dysfunction, unspecified: Secondary | ICD-10-CM | POA: Diagnosis not present

## 2023-12-13 DIAGNOSIS — Z125 Encounter for screening for malignant neoplasm of prostate: Secondary | ICD-10-CM | POA: Diagnosis not present

## 2023-12-13 DIAGNOSIS — E291 Testicular hypofunction: Secondary | ICD-10-CM | POA: Diagnosis not present

## 2023-12-13 DIAGNOSIS — E785 Hyperlipidemia, unspecified: Secondary | ICD-10-CM | POA: Diagnosis not present

## 2023-12-13 DIAGNOSIS — R799 Abnormal finding of blood chemistry, unspecified: Secondary | ICD-10-CM | POA: Diagnosis not present

## 2023-12-13 DIAGNOSIS — I10 Essential (primary) hypertension: Secondary | ICD-10-CM | POA: Diagnosis not present

## 2023-12-13 DIAGNOSIS — D529 Folate deficiency anemia, unspecified: Secondary | ICD-10-CM | POA: Diagnosis not present

## 2023-12-13 DIAGNOSIS — Z6832 Body mass index (BMI) 32.0-32.9, adult: Secondary | ICD-10-CM | POA: Diagnosis not present

## 2023-12-13 DIAGNOSIS — E66812 Obesity, class 2: Secondary | ICD-10-CM | POA: Diagnosis not present

## 2023-12-20 ENCOUNTER — Other Ambulatory Visit: Payer: Self-pay

## 2023-12-20 ENCOUNTER — Ambulatory Visit: Admitting: Physician Assistant

## 2023-12-20 ENCOUNTER — Encounter: Payer: Self-pay | Admitting: Physician Assistant

## 2023-12-20 VITALS — BP 117/75 | HR 67 | Resp 16 | Ht 70.0 in | Wt 223.0 lb

## 2023-12-20 DIAGNOSIS — E7800 Pure hypercholesterolemia, unspecified: Secondary | ICD-10-CM

## 2023-12-20 DIAGNOSIS — R5383 Other fatigue: Secondary | ICD-10-CM | POA: Diagnosis not present

## 2023-12-20 DIAGNOSIS — R5381 Other malaise: Secondary | ICD-10-CM | POA: Diagnosis not present

## 2023-12-20 DIAGNOSIS — R7989 Other specified abnormal findings of blood chemistry: Secondary | ICD-10-CM

## 2023-12-20 DIAGNOSIS — M25561 Pain in right knee: Secondary | ICD-10-CM

## 2023-12-20 DIAGNOSIS — F419 Anxiety disorder, unspecified: Secondary | ICD-10-CM | POA: Diagnosis not present

## 2023-12-20 DIAGNOSIS — B009 Herpesviral infection, unspecified: Secondary | ICD-10-CM

## 2023-12-20 DIAGNOSIS — J302 Other seasonal allergic rhinitis: Secondary | ICD-10-CM

## 2023-12-20 MED ORDER — VALACYCLOVIR HCL 1 G PO TABS
ORAL_TABLET | ORAL | 0 refills | Status: AC
Start: 2023-12-20 — End: ?

## 2023-12-20 NOTE — Progress Notes (Signed)
 Established patient visit  Patient: Luis Ray   DOB: May 30, 1966   58 y.o. Male  MRN: [id number] Visit Date: 12/20/2023  Today's healthcare provider: Blane Bunting, PA-C   Chief Complaint  Patient presents with   Transitions Of Care    TOC/ meds    Subjective     Discussed the use of AI scribe software for clinical note transcription with the patient, who gave verbal consent to proceed.  History of Present Illness Luis Ray is a 58 year old male who presents for medication management and transition of care.  He is transitioning his care and discusses medication management, particularly concerning testosterone . He has been using testosterone  injections weekly since September but is currently out of the medication due to scheduling difficulties. His testosterone  levels were low before starting treatment. He does not experience fatigue, tiredness, or lack of energy currently, although these symptoms were present prior to therapy. No urinary issues are reported.  He takes Cardizem  for heart issues, Crestor  40 mg daily for cholesterol, Viagra  100 mg as needed, Buspirone  for anxiety, and Valtrex  for cold sores as needed. He also takes aspirin  daily without a history of stroke, atrial fibrillation, deep vein thrombosis, or pulmonary embolism.  He has a torn meniscus diagnosed two months ago, confirmed by MRI, which also showed knee arthritis. He experiences pain and swelling in the knee, especially with prolonged walking. He takes meloxicam  twice a day and supplements with Tylenol and multivitamins, including turmeric with curcumin, vitamin B, and glucosamine.       12/20/2023   10:35 AM 04/07/2023    9:08 AM 03/08/2022   10:16 AM  Depression screen PHQ 2/9  Decreased Interest 0 0 0  Down, Depressed, Hopeless 0 0 0  PHQ - 2 Score 0 0 0  Altered sleeping 0  1  Tired, decreased energy 1  0  Change in appetite 0  0  Feeling bad or failure about yourself  0  0  Trouble concentrating 0   0  Moving slowly or fidgety/restless 0  0  Suicidal thoughts 0  0  PHQ-9 Score 1  1  Difficult doing work/chores Somewhat difficult  Not difficult at all      12/20/2023   10:35 AM 04/07/2023    9:08 AM  GAD 7 : Generalized Anxiety Score  Nervous, Anxious, on Edge 0 0  Control/stop worrying 1 0  Worry too much - different things 0 0  Trouble relaxing 1 0  Restless 0 0  Easily annoyed or irritable 1 1  Afraid - awful might happen 0 0  Total GAD 7 Score 3 1  Anxiety Difficulty Somewhat difficult Somewhat difficult    Medications: Outpatient Medications Prior to Visit  Medication Sig   aspirin  EC 81 MG tablet Take 1 tablet (81 mg total) by mouth daily. Swallow whole.   BD PLASTIPAK SYRINGE 21G X 1" 3 ML MISC 1 EACH BY DOES NOT APPLY ROUTE ONCE A WEEK.   busPIRone  (BUSPAR ) 15 MG tablet Take 1 tablet (15 mg total) by mouth 2 (two) times daily.   diltiazem  (CARDIZEM  CD) 120 MG 24 hr capsule TAKE 1 CAPSULE BY MOUTH EVERY DAY   meloxicam  (MOBIC ) 15 MG tablet TAKE 1 TABLET EVERY DAY BY ORAL ROUTE WITH MEAL(S).   rosuvastatin  (CRESTOR ) 40 MG tablet Take 1 tablet (40 mg total) by mouth daily.   sildenafil  (VIAGRA ) 100 MG tablet Take 1 tablet (100 mg total) by mouth daily as needed for erectile dysfunction.  Testosterone  Cypionate 200 MG/ML SOLN Inject 100 mg as directed once a week.   [DISCONTINUED] valACYclovir  (VALTREX ) 1000 MG tablet For HSV flares; use twice daily for 7-10 days.   finasteride  (PROSCAR ) 5 MG tablet Take 1 tablet (5 mg total) by mouth daily.   predniSONE  (STERAPRED UNI-PAK 21 TAB) 10 MG (21) TBPK tablet Take 6 tabs on day 1, 5 tabs on day 2 and decrease by one tab until done. Take with breakfast. (Patient not taking: Reported on 12/20/2023)   No facility-administered medications prior to visit.    Review of Systems All negative Except see HPI       Objective    BP 117/75 (BP Location: Left Arm, Patient Position: Sitting, Cuff Size: Large)   Pulse 67   Resp 16    Ht 5\' 10"  (1.778 m)   Wt 223 lb (101.2 kg)   SpO2 98%   BMI 32.00 kg/m     Physical Exam Vitals reviewed.  Constitutional:      General: He is not in acute distress.    Appearance: Normal appearance. He is not diaphoretic.  HENT:     Head: Normocephalic and atraumatic.  Eyes:     General: No scleral icterus.    Conjunctiva/sclera: Conjunctivae normal.  Cardiovascular:     Rate and Rhythm: Normal rate and regular rhythm.     Pulses: Normal pulses.     Heart sounds: Normal heart sounds. No murmur heard. Pulmonary:     Effort: Pulmonary effort is normal. No respiratory distress.     Breath sounds: Normal breath sounds. No wheezing or rhonchi.  Musculoskeletal:     Cervical back: Neck supple.     Right lower leg: No edema.     Left lower leg: No edema.  Lymphadenopathy:     Cervical: No cervical adenopathy.  Skin:    General: Skin is warm and dry.     Findings: No rash.  Neurological:     Mental Status: He is alert and oriented to person, place, and time. Mental status is at baseline.  Psychiatric:        Mood and Affect: Mood normal.        Behavior: Behavior normal.      Results for orders placed or performed in visit on 12/20/23  Hemoglobin A1c  Result Value Ref Range   Hgb A1c MFr Bld 5.3 4.8 - 5.6 %   Est. average glucose Bld gHb Est-mCnc 105 mg/dL  Lipid panel  Result Value Ref Range   Cholesterol, Total 147 100 - 199 mg/dL   Triglycerides 962 0 - 149 mg/dL   HDL 46 >95 mg/dL   VLDL Cholesterol Cal 24 5 - 40 mg/dL   LDL Chol Calc (NIH) 77 0 - 99 mg/dL   Chol/HDL Ratio 3.2 0.0 - 5.0 ratio  TSH  Result Value Ref Range   TSH 1.410 0.450 - 4.500 uIU/mL  Comprehensive metabolic panel with GFR  Result Value Ref Range   Glucose 74 70 - 99 mg/dL   BUN 13 6 - 24 mg/dL   Creatinine, Ser 2.84 0.76 - 1.27 mg/dL   eGFR 69 >13 KG/MWN/0.27   BUN/Creatinine Ratio 11 9 - 20   Sodium 140 134 - 144 mmol/L   Potassium 4.5 3.5 - 5.2 mmol/L   Chloride 99 96 - 106 mmol/L    CO2 24 20 - 29 mmol/L   Calcium  9.9 8.7 - 10.2 mg/dL   Total Protein 7.0 6.0 - 8.5 g/dL  Albumin 4.6 3.8 - 4.9 g/dL   Globulin, Total 2.4 1.5 - 4.5 g/dL   Bilirubin Total 0.4 0.0 - 1.2 mg/dL   Alkaline Phosphatase 63 44 - 121 IU/L   AST 26 0 - 40 IU/L   ALT 27 0 - 44 IU/L  CBC with Differential/Platelet  Result Value Ref Range   WBC 5.7 3.4 - 10.8 x10E3/uL   RBC 5.41 4.14 - 5.80 x10E6/uL   Hemoglobin 16.4 13.0 - 17.7 g/dL   Hematocrit 16.1 09.6 - 51.0 %   MCV 93 79 - 97 fL   MCH 30.3 26.6 - 33.0 pg   MCHC 32.5 31.5 - 35.7 g/dL   RDW 04.5 40.9 - 81.1 %   Platelets 251 150 - 450 x10E3/uL   Neutrophils 51 Not Estab. %   Lymphs 33 Not Estab. %   Monocytes 12 Not Estab. %   Eos 2 Not Estab. %   Basos 1 Not Estab. %   Neutrophils Absolute 3.0 1.4 - 7.0 x10E3/uL   Lymphocytes Absolute 1.9 0.7 - 3.1 x10E3/uL   Monocytes Absolute 0.7 0.1 - 0.9 x10E3/uL   EOS (ABSOLUTE) 0.1 0.0 - 0.4 x10E3/uL   Basophils Absolute 0.0 0.0 - 0.2 x10E3/uL   Immature Granulocytes 1 Not Estab. %   Immature Grans (Abs) 0.0 0.0 - 0.1 x10E3/uL  Testosterone ,Free and Total  Result Value Ref Range   Testosterone  856 264 - 916 ng/dL   Testosterone , Free WILL FOLLOW         Assessment & Plan Torn meniscus with knee arthritis Was diagnosed 2 month ago Torn meniscus with knee arthritis causing pain and swelling. Managed with meloxicam  and acetaminophen. Advised meloxicam  with meals and vitamin B supplementation. - Continue meloxicam  with meals. - Continue acetaminophen to reduce meloxicam  need. - Continue vitamin B supplementation. Continue to follow-up with orthopedics  Hyperlipidemia Atherosclerosis with lipid disorder Atherosclerosis with lipid disorder managed with Cardizem , Crestor , and low-dose aspirin . EKG normal. - Continue Cardizem  and Crestor . - Continue low-dose aspirin . Will follow-up  Anxiety Chronic Anxiety managed with buspirone . No recent exacerbations. - Continue buspirone   15mg  Will follow-up.  Testosterone  deficiency Chronic Low testosterone  previously confirmed. Received testosterone  injections. Referral to urology or endocrinology planned. Blood work including testosterone  levels ordered. - Order testosterone  blood test. - Refer to urology for testosterone  management.  Cold sores (Herpes labialis) Recurrent Cold sores managed with valacyclovir  at symptom onset. Prefers medication on hand. - Prescribe valacyclovir  for episodic treatment.  Seasonal allergies Seasonal allergies managed with Zyrtec, Claritin, nasal saline rinse, and Flonase . Saline rinse before Flonase  recommended. - Continue Zyrtec and Claritin as needed. - Use nasal saline rinse before Flonase .  Hypercholesterolemia without hypertriglyceridemia  - Hemoglobin A1c - Lipid panel - TSH - Comprehensive metabolic panel with GFR - CBC with Differential/Platelet - Testosterone ,Free and Total  Malaise and fatigue Chronic Workup ordered - Hemoglobin A1c - Lipid panel - TSH - Comprehensive metabolic panel with GFR - CBC with Differential/Platelet - Testosterone ,Free and Total Will reassess after  receiving lab results   Herpes  - valACYclovir  (VALTREX ) 1000 MG tablet; For HSV flares; use twice daily for 7-10 days.  Dispense: 90 tablet; Refill: 0   Orders Placed This Encounter  Procedures   Hemoglobin A1c   Lipid panel    Has the patient fasted?:   Yes   TSH   Comprehensive metabolic panel with GFR    Has the patient fasted?:   Yes   CBC with Differential/Platelet   Testosterone ,Free and Total  Return in about 4 months (around 04/21/2024) for CPE.   The patient was advised to call back or seek an in-person evaluation if the symptoms worsen or if the condition fails to improve as anticipated.  I discussed the assessment and treatment plan with the patient. The patient was provided an opportunity to ask questions and all were answered. The patient agreed with the plan and  demonstrated an understanding of the instructions.  I, Antwane Grose, PA-C have reviewed all documentation for this visit. The documentation on 12/20/2023  for the exam, diagnosis, procedures, and orders are all accurate and complete.  Blane Bunting, [facility], MMS [facility] [phone] (phone) [phone] (fax)  [facility]

## 2023-12-21 LAB — COMPREHENSIVE METABOLIC PANEL WITH GFR
ALT: 27 IU/L (ref 0–44)
AST: 26 IU/L (ref 0–40)
Albumin: 4.6 g/dL (ref 3.8–4.9)
Alkaline Phosphatase: 63 IU/L (ref 44–121)
BUN/Creatinine Ratio: 11 (ref 9–20)
BUN: 13 mg/dL (ref 6–24)
Bilirubin Total: 0.4 mg/dL (ref 0.0–1.2)
CO2: 24 mmol/L (ref 20–29)
Calcium: 9.9 mg/dL (ref 8.7–10.2)
Chloride: 99 mmol/L (ref 96–106)
Creatinine, Ser: 1.21 mg/dL (ref 0.76–1.27)
Globulin, Total: 2.4 g/dL (ref 1.5–4.5)
Glucose: 74 mg/dL (ref 70–99)
Potassium: 4.5 mmol/L (ref 3.5–5.2)
Sodium: 140 mmol/L (ref 134–144)
Total Protein: 7 g/dL (ref 6.0–8.5)
eGFR: 69 mL/min/{1.73_m2} (ref >59)

## 2023-12-21 LAB — LIPID PANEL
Chol/HDL Ratio: 3.2 ratio (ref 0.0–5.0)
Cholesterol, Total: 147 mg/dL (ref 100–199)
HDL: 46 mg/dL (ref >39)
LDL Chol Calc (NIH): 77 mg/dL (ref 0–99)
Triglycerides: 136 mg/dL (ref 0–149)
VLDL Cholesterol Cal: 24 mg/dL (ref 5–40)

## 2023-12-21 LAB — CBC WITH DIFFERENTIAL/PLATELET
Basophils Absolute: 0 10*3/uL (ref 0.0–0.2)
Basos: 1 %
EOS (ABSOLUTE): 0.1 10*3/uL (ref 0.0–0.4)
Eos: 2 %
Hematocrit: 50.4 % (ref 37.5–51.0)
Hemoglobin: 16.4 g/dL (ref 13.0–17.7)
Immature Grans (Abs): 0 10*3/uL (ref 0.0–0.1)
Immature Granulocytes: 1 %
Lymphocytes Absolute: 1.9 10*3/uL (ref 0.7–3.1)
Lymphs: 33 %
MCH: 30.3 pg (ref 26.6–33.0)
MCHC: 32.5 g/dL (ref 31.5–35.7)
MCV: 93 fL (ref 79–97)
Monocytes Absolute: 0.7 10*3/uL (ref 0.1–0.9)
Monocytes: 12 %
Neutrophils Absolute: 3 10*3/uL (ref 1.4–7.0)
Neutrophils: 51 %
Platelets: 251 10*3/uL (ref 150–450)
RBC: 5.41 x10E6/uL (ref 4.14–5.80)
RDW: 13.9 % (ref 11.6–15.4)
WBC: 5.7 10*3/uL (ref 3.4–10.8)

## 2023-12-21 LAB — TSH: TSH: 1.41 u[IU]/mL (ref 0.450–4.500)

## 2023-12-21 LAB — HEMOGLOBIN A1C
Est. average glucose Bld gHb Est-mCnc: 105 mg/dL
Hgb A1c MFr Bld: 5.3 % (ref 4.8–5.6)

## 2023-12-21 LAB — TESTOSTERONE,FREE AND TOTAL
Testosterone, Free: 8.8 pg/mL (ref 7.2–24.0)
Testosterone: 856 ng/dL (ref 264–916)

## 2023-12-22 ENCOUNTER — Ambulatory Visit: Payer: Self-pay | Admitting: Physician Assistant

## 2024-01-10 DIAGNOSIS — Z6831 Body mass index (BMI) 31.0-31.9, adult: Secondary | ICD-10-CM | POA: Diagnosis not present

## 2024-01-10 DIAGNOSIS — E669 Obesity, unspecified: Secondary | ICD-10-CM | POA: Diagnosis not present

## 2024-01-10 DIAGNOSIS — I10 Essential (primary) hypertension: Secondary | ICD-10-CM | POA: Diagnosis not present

## 2024-02-14 DIAGNOSIS — E669 Obesity, unspecified: Secondary | ICD-10-CM | POA: Diagnosis not present

## 2024-02-14 DIAGNOSIS — E782 Mixed hyperlipidemia: Secondary | ICD-10-CM | POA: Diagnosis not present

## 2024-02-14 DIAGNOSIS — I10 Essential (primary) hypertension: Secondary | ICD-10-CM | POA: Diagnosis not present

## 2024-02-14 DIAGNOSIS — Z6830 Body mass index (BMI) 30.0-30.9, adult: Secondary | ICD-10-CM | POA: Diagnosis not present

## 2024-02-23 ENCOUNTER — Other Ambulatory Visit: Payer: Self-pay

## 2024-02-23 ENCOUNTER — Telehealth: Payer: Self-pay | Admitting: Physician Assistant

## 2024-02-23 DIAGNOSIS — F419 Anxiety disorder, unspecified: Secondary | ICD-10-CM

## 2024-02-23 MED ORDER — BUSPIRONE HCL 15 MG PO TABS
15.0000 mg | ORAL_TABLET | Freq: Two times a day (BID) | ORAL | 3 refills | Status: AC
Start: 2024-02-23 — End: ?

## 2024-02-23 NOTE — Telephone Encounter (Signed)
[facility] ref[facility] for the following medications: ? ?busPIRone (BUSPAR) 15 MG tablet ? ? ?Please advise ? ?

## 2024-02-23 NOTE — Telephone Encounter (Signed)
Converted to refill req

## 2024-03-14 DIAGNOSIS — E782 Mixed hyperlipidemia: Secondary | ICD-10-CM | POA: Diagnosis not present

## 2024-03-14 DIAGNOSIS — Z6830 Body mass index (BMI) 30.0-30.9, adult: Secondary | ICD-10-CM | POA: Diagnosis not present

## 2024-03-14 DIAGNOSIS — E669 Obesity, unspecified: Secondary | ICD-10-CM | POA: Diagnosis not present

## 2024-03-14 DIAGNOSIS — M25561 Pain in right knee: Secondary | ICD-10-CM | POA: Diagnosis not present

## 2024-04-05 ENCOUNTER — Other Ambulatory Visit: Payer: Self-pay | Admitting: Physician Assistant

## 2024-04-05 DIAGNOSIS — N401 Enlarged prostate with lower urinary tract symptoms: Secondary | ICD-10-CM

## 2024-04-05 NOTE — Telephone Encounter (Signed)
[facility] faxed refill request for the following medications:  finasteride (PROSCAR) 5 MG tablet    Please advise.

## 2024-04-05 NOTE — Telephone Encounter (Signed)
 Was not sure if I am able to refill  LOV: 12/20/2023 NOV: 04/19/2024 LRF: 04/07/2023 #[id number]

## 2024-04-08 MED ORDER — FINASTERIDE 5 MG PO TABS
5.0000 mg | ORAL_TABLET | Freq: Every day | ORAL | 3 refills | Status: AC
Start: 2024-04-08 — End: ?

## 2024-04-18 DIAGNOSIS — Z6830 Body mass index (BMI) 30.0-30.9, adult: Secondary | ICD-10-CM | POA: Diagnosis not present

## 2024-04-18 DIAGNOSIS — E782 Mixed hyperlipidemia: Secondary | ICD-10-CM | POA: Diagnosis not present

## 2024-04-18 DIAGNOSIS — E669 Obesity, unspecified: Secondary | ICD-10-CM | POA: Diagnosis not present

## 2024-04-19 ENCOUNTER — Ambulatory Visit (INDEPENDENT_AMBULATORY_CARE_PROVIDER_SITE_OTHER): Admitting: Physician Assistant

## 2024-04-19 ENCOUNTER — Encounter: Payer: Self-pay | Admitting: Physician Assistant

## 2024-04-19 VITALS — BP 110/76 | HR 65 | Ht 70.0 in | Wt 215.0 lb

## 2024-04-19 DIAGNOSIS — Z125 Encounter for screening for malignant neoplasm of prostate: Secondary | ICD-10-CM | POA: Diagnosis not present

## 2024-04-19 DIAGNOSIS — G4700 Insomnia, unspecified: Secondary | ICD-10-CM | POA: Diagnosis not present

## 2024-04-19 DIAGNOSIS — Z0001 Encounter for general adult medical examination with abnormal findings: Secondary | ICD-10-CM

## 2024-04-19 DIAGNOSIS — E7800 Pure hypercholesterolemia, unspecified: Secondary | ICD-10-CM | POA: Diagnosis not present

## 2024-04-19 DIAGNOSIS — Z0000 Encounter for general adult medical examination without abnormal findings: Secondary | ICD-10-CM | POA: Insufficient documentation

## 2024-04-19 DIAGNOSIS — M25561 Pain in right knee: Secondary | ICD-10-CM

## 2024-04-19 DIAGNOSIS — J302 Other seasonal allergic rhinitis: Secondary | ICD-10-CM | POA: Diagnosis not present

## 2024-04-19 MED ORDER — FLUTICASONE PROPIONATE 50 MCG/ACT NA SUSP
2.0000 | Freq: Every day | NASAL | 6 refills | Status: AC
Start: 2024-04-19 — End: ?

## 2024-04-19 NOTE — Progress Notes (Signed)
 Complete physical exam  Patient: Luis Ray   DOB: [birth year]   58 y.o. Male  MRN: [id number] Visit Date: 04/19/2024  Today's healthcare provider: Jolynn Spencer, PA-C   Chief Complaint  Patient presents with   Annual Exam    Patient reports he is feeling well but not sleeping as well as he would like.  Tossing and turning at night.  Eats healthy and walks twice per week.   Subjective    Luis Ray is a 58 y.o. male who presents today for a complete physical exam.   Discussed the use of AI scribe software for clinical note transcription with the patient, who gave verbal consent to proceed.  History of Present Illness Luis Ray is a 58 year old male who presents with sleep disturbances and nasal congestion.  He experiences frequent awakenings and restlessness at night. He confirms snoring but denies gasping for air during sleep. He denies significant anxiety or stress.  Nasal congestion occurs particularly in the mornings, attributed to dry weather. He previously used Flonase  but is currently out of it. He takes Zyrtec daily, which is more effective when taken in the afternoon. He uses Refresh eye drops for his contact lenses.  He continues to take Cardizem  and finasteride  daily. He occasionally uses sildenafil  and has stopped taking testosterone .  Labs 12/2023 wnl  Last depression screening scores    04/19/2024    9:12 AM 12/20/2023   10:35 AM 04/07/2023    9:08 AM  PHQ 2/9 Scores  PHQ - 2 Score 0 0 0  PHQ- 9 Score 2 1    Last fall risk screening    04/19/2024    9:12 AM  Fall Risk   Falls in the past year? 0  Number falls in past yr: 0  Injury with Fall? 0   Last Audit-C alcohol use screening    03/08/2022   10:17 AM  Alcohol Use Disorder Test (AUDIT)  1. How often do you have a drink containing alcohol? 2  2. How many drinks containing alcohol do you have on a typical day when you are drinking? 0  3. How often do you have six or more drinks on one  occasion? 1  AUDIT-C Score 3   A score of 3 or more in women, and 4 or more in men indicates increased risk for alcohol abuse, EXCEPT if all of the points are from question 1   Past Medical History:  Diagnosis Date   Alopecia    History of 2019 novel coronavirus disease (COVID-19) 06/2019   Insomnia    Past Surgical History:  Procedure Laterality Date   COLONOSCOPY WITH PROPOFOL  N/A 03/20/2020   Procedure: COLONOSCOPY WITH PROPOFOL ;  Surgeon: Jinny Carmine, MD;  Location: [facility];  Service: Endoscopy;  Laterality: N/A;  priority 4   ROTATOR CUFF REPAIR Right    Social History   Socioeconomic History   Marital status: Married    Spouse name: Not on file   Number of children: Not on file   Years of education: Not on file   Highest education level: Not on file  Occupational History   Not on file  Tobacco Use   Smoking status: Never   Smokeless tobacco: Never  Vaping Use   Vaping status: Never Used  Substance and Sexual Activity   Alcohol use: Yes    Alcohol/week: 8.0 standard drinks of alcohol    Types: 8 Cans of beer per week  Comment: rare   Drug use: No   Sexual activity: Not on file  Other Topics Concern   Not on file  Social History Narrative   Not on file   Social Drivers of Health   Financial Resource Strain: Not on file  Food Insecurity: Not on file  Transportation Needs: Not on file  Physical Activity: Not on file  Stress: Not on file  Social Connections: Not on file  Intimate Partner Violence: Not on file   Family Status  Relation Name Status   Mother  Alive   Father  Alive   Mat Uncle  Deceased       heart attack   MGF  Deceased       heart attack  No partnership data on file   Family History  Problem Relation Age of Onset   Coronary artery disease Father    Allergies  Allergen Reactions   Sulfa Antibiotics Other (See Comments)    Upset stomach  Other Reaction(s): Other (See Comments)    Upset stomach    Patient Care  Team: Meilyn Heindl, PA-C as PCP - General (Physician Assistant) Darron Deatrice LABOR, MD as PCP - Cardiology (Cardiology)   Medications: Outpatient Medications Prior to Visit  Medication Sig   aspirin  EC 81 MG tablet Take 1 tablet (81 mg total) by mouth daily. Swallow whole.   BD PLASTIPAK SYRINGE 21G X 1 3 ML MISC 1 EACH BY DOES NOT APPLY ROUTE ONCE A WEEK.   busPIRone  (BUSPAR ) 15 MG tablet Take 1 tablet (15 mg total) by mouth 2 (two) times daily.   diltiazem  (CARDIZEM  CD) 120 MG 24 hr capsule TAKE 1 CAPSULE BY MOUTH EVERY DAY   finasteride  (PROSCAR ) 5 MG tablet Take 1 tablet (5 mg total) by mouth daily.   meloxicam  (MOBIC ) 15 MG tablet TAKE 1 TABLET EVERY DAY BY ORAL ROUTE WITH MEAL(S).   rosuvastatin  (CRESTOR ) 40 MG tablet Take 1 tablet (40 mg total) by mouth daily.   sildenafil  (VIAGRA ) 100 MG tablet Take 1 tablet (100 mg total) by mouth daily as needed for erectile dysfunction.   Testosterone  Cypionate 200 MG/ML SOLN Inject 100 mg as directed once a week.   valACYclovir  (VALTREX ) 1000 MG tablet For HSV flares; use twice daily for 7-10 days.   predniSONE  (STERAPRED UNI-PAK 21 TAB) 10 MG (21) TBPK tablet Take 6 tabs on day 1, 5 tabs on day 2 and decrease by one tab until done. Take with breakfast. (Patient not taking: Reported on 12/20/2023)   No facility-administered medications prior to visit.    Review of Systems  All other systems reviewed and are negative.  Except see HPI     Objective    BP 110/76 (BP Location: Right Arm, Patient Position: Sitting, Cuff Size: Normal)   Pulse 65   Ht 5' 10 (1.778 m)   Wt 215 lb (97.5 kg)   SpO2 94%   BMI 30.85 kg/m      Physical Exam Vitals reviewed.  Constitutional:      General: He is not in acute distress.    Appearance: Normal appearance. He is well-developed. He is not ill-appearing, toxic-appearing or diaphoretic.  HENT:     Head: Normocephalic and atraumatic.     Right Ear: Tympanic membrane, ear canal and external ear  normal.     Left Ear: Tympanic membrane, ear canal and external ear normal.     Nose: Nose normal. No congestion or rhinorrhea.     Mouth/Throat:  Mouth: Mucous membranes are moist.     Pharynx: Oropharynx is clear. No oropharyngeal exudate.  Eyes:     General: No scleral icterus.       Right eye: No discharge.        Left eye: No discharge.     Conjunctiva/sclera: Conjunctivae normal.     Pupils: Pupils are equal, round, and reactive to light.  Neck:     Thyroid : No thyromegaly.     Vascular: No carotid bruit.  Cardiovascular:     Rate and Rhythm: Normal rate and regular rhythm.     Pulses: Normal pulses.     Heart sounds: Normal heart sounds. No murmur heard.    No friction rub. No gallop.  Pulmonary:     Effort: Pulmonary effort is normal. No respiratory distress.     Breath sounds: Normal breath sounds. No wheezing or rales.  Abdominal:     General: Abdomen is flat. Bowel sounds are normal. There is no distension.     Palpations: Abdomen is soft. There is no mass.     Tenderness: There is no abdominal tenderness. There is no right CVA tenderness, left CVA tenderness, guarding or rebound.     Hernia: No hernia is present.  Musculoskeletal:        General: No swelling, tenderness, deformity or signs of injury. Normal range of motion.     Cervical back: Normal range of motion and neck supple. No rigidity or tenderness.     Right lower leg: No edema.     Left lower leg: No edema.  Lymphadenopathy:     Cervical: No cervical adenopathy.  Skin:    General: Skin is warm and dry.     Coloration: Skin is not jaundiced or pale.     Findings: No bruising, erythema, lesion or rash.  Neurological:     Mental Status: He is alert and oriented to person, place, and time. Mental status is at baseline.     Gait: Gait normal.  Psychiatric:        Mood and Affect: Mood normal.        Behavior: Behavior normal.        Thought Content: Thought content normal.        Judgment: Judgment  normal.      No results found for any visits on 04/19/24.  Assessment & Plan    Routine Health Maintenance and Physical Exam  Exercise Activities and Dietary recommendations  Goals   None     Immunization History  Administered Date(s) Administered   Influenza,inj,Quad PF,6+ Mos 08/19/2014, 08/31/2016, 09/16/2017, 06/22/2018   Tdap 09/05/2009, 12/26/2019    Health Maintenance  Topic Date Due   Pneumococcal Vaccine for age over 46 (1 of 2 - PCV) Never done   Hepatitis B Vaccine (1 of 3 - 19+ 3-dose series) Never done   Zoster (Shingles) Vaccine (1 of 2) Never done   COVID-19 Vaccine (1 - 2024-25 season) Never done   Flu Shot  10/30/2024*   Colon Cancer Screening  03/21/2027   DTaP/Tdap/Td vaccine (3 - Td or Tdap) 12/25/2029   Hepatitis C Screening  Completed   HIV Screening  Completed   HPV Vaccine  Aged Out   Meningitis B Vaccine  Aged Out  *Topic was postponed. The date shown is not the original due date.    Discussed health benefits of physical activity, and encouraged him to engage in regular exercise appropriate for his age and condition.  Assessment & Plan  Insomnia Chronic sleep difficulty with frequent awakenings. No anxiety or stress identified. - Advise on sleep hygiene: consistent schedule, minimize electronics, quiet dark environment. - Suggest white noise or sleep music. - Recommend OTC sleep aids like melatonin if needed. - Encourage physical exercise up to five times a week. Encouraged magnesium over the counter Will follow-up  Right knee pain secondary to meniscal tear Chronic pain from small meniscal tear. Symptoms improved with conservative management.  Seasonal allergic rhinitis Recurrent nasal congestion and rhinorrhea due to dry weather. Zyrtec provides better relief in the afternoon. - Prescribe Flonase . - Continue Zyrtec in the afternoon. - Recommend nasal saline rinse or spray.  Contact lens-associated dry eyes Dry eyes from contact lens  use managed with Refresh drops. - Continue Refresh eye drops daily.  Hypercholesterolemia Chronic and stable Managed with cholesterol medication/rosuvastatin  40mg . Continue low cholesterol diet and daily exercise Will follow-up  Prostate cancer screening Discussed PSA testing for screening. - Order PSA test for prostate cancer screening.   Seasonal allergies - fluticasone  (FLONASE ) 50 MCG/ACT nasal spray; Place 2 sprays into both nostrils daily.  Dispense: 16 g; Refill: 6  Prostate cancer screening  - PSA Total (Reflex To Free)  Annual physical exam (Primary) Well adult visit with abnormal findings Things to do to keep yourself healthy  - Exercise at least 30-45 minutes a day, 3-4 days a week.  - Eat a low-fat diet with lots of fruits and vegetables, up to 7-9 servings per day.  - Seatbelts can save your life. Wear them always.  - Smoke detectors on every level of your home, check batteries every year.  - Eye Doctor - have an eye exam every 1-2 years  - Safe sex - if you may be exposed to STDs, use a condom.  - Alcohol -  If you drink, do it moderately, less than 2 drinks per day.  - Health Care Power of Attorney. Choose someone to speak for you if you are not able.  - Depression is common in our stressful world.If you're feeling down or losing interest in things you normally enjoy, please come in for a visit.  - Violence - If anyone is threatening or hurting you, please call immediately.   No follow-ups on file.    The patient was advised to call back or seek an in-person evaluation if the symptoms worsen or if the condition fails to improve as anticipated.  I discussed the assessment and treatment plan with the patient. The patient was provided an opportunity to ask questions and all were answered. The patient agreed with the plan and demonstrated an understanding of the instructions.  I, Londen Bok, PA-C have reviewed all documentation for this visit. The documentation  on 04/19/2024  for the exam, diagnosis, procedures, and orders are all accurate and complete.  Jolynn Spencer, [facility], MMS [facility] [phone] (phone) [phone] (fax)  [facility]

## 2024-04-20 LAB — PSA TOTAL (REFLEX TO FREE): Prostate Specific Ag, Serum: 0.1 ng/mL (ref 0.0–4.0)

## 2024-04-22 ENCOUNTER — Ambulatory Visit: Payer: Self-pay | Admitting: Physician Assistant

## 2024-04-24 ENCOUNTER — Other Ambulatory Visit: Payer: Self-pay

## 2024-04-24 ENCOUNTER — Telehealth: Payer: Self-pay | Admitting: Physician Assistant

## 2024-04-24 MED ORDER — ROSUVASTATIN CALCIUM 40 MG PO TABS: 40.0000 mg | ORAL_TABLET | Freq: Every day | ORAL | 3 refills | Status: AC

## 2024-04-24 NOTE — Telephone Encounter (Signed)
[facility] faxed refill request for the following medications:  rosuvastatin (CRESTOR) 40 MG tablet   Please advise.  

## 2024-06-08 ENCOUNTER — Telehealth: Payer: Self-pay

## 2024-06-08 NOTE — Telephone Encounter (Signed)
 Spoke with Luke (wife) information was provided, Dr. Deatrice Cage googled his office number [phone]

## 2024-06-08 NOTE — Telephone Encounter (Signed)
 Copied from CRM [id number]. Topic: General - Other >> Jun 08, 2024 11:19 AM Gustabo D wrote: Name of cardiac doc  and phone number pt sees call pt wife back with this info [phone]

## 2024-07-30 ENCOUNTER — Other Ambulatory Visit: Payer: Self-pay | Admitting: Physician Assistant
